# Patient Record
Sex: Female | Born: 1940
Health system: Southern US, Community
[De-identification: ages and names within clinical notes are randomized; demographics above are authoritative.]

## PROBLEM LIST (undated history)

## (undated) DIAGNOSIS — E7800 Pure hypercholesterolemia, unspecified: Secondary | ICD-10-CM

## (undated) DIAGNOSIS — I10 Essential (primary) hypertension: Secondary | ICD-10-CM

## (undated) DIAGNOSIS — C50919 Malignant neoplasm of unspecified site of unspecified female breast: Secondary | ICD-10-CM

## (undated) DIAGNOSIS — F419 Anxiety disorder, unspecified: Secondary | ICD-10-CM

## (undated) DIAGNOSIS — R011 Cardiac murmur, unspecified: Secondary | ICD-10-CM

## (undated) DIAGNOSIS — M1990 Unspecified osteoarthritis, unspecified site: Secondary | ICD-10-CM

## (undated) DIAGNOSIS — C50412 Malignant neoplasm of upper-outer quadrant of left female breast: Principal | ICD-10-CM

## (undated) DIAGNOSIS — R350 Frequency of micturition: Secondary | ICD-10-CM

## (undated) DIAGNOSIS — Z803 Family history of malignant neoplasm of breast: Secondary | ICD-10-CM

## (undated) HISTORY — DX: Unspecified osteoarthritis, unspecified site: M19.90

## (undated) HISTORY — PX: COLONOSCOPY: SHX174

## (undated) HISTORY — DX: Malignant neoplasm of unspecified site of unspecified female breast: C50.919

## (undated) HISTORY — DX: Family history of malignant neoplasm of breast: Z80.3

## (undated) HISTORY — PX: EYE SURGERY: SHX253

## (undated) HISTORY — PX: BREAST LUMPECTOMY: SHX2

## (undated) HISTORY — PX: BREAST EXCISIONAL BIOPSY: SUR124

## (undated) HISTORY — PX: TONSILLECTOMY: SUR1361

## (undated) HISTORY — PX: BREAST SURGERY: SHX581

## (undated) HISTORY — DX: Malignant neoplasm of upper-outer quadrant of left female breast: C50.412

---

## 2010-11-12 ENCOUNTER — Other Ambulatory Visit: Payer: Self-pay | Admitting: Family Medicine

## 2010-11-12 DIAGNOSIS — R809 Proteinuria, unspecified: Secondary | ICD-10-CM

## 2011-05-27 ENCOUNTER — Emergency Department (HOSPITAL_COMMUNITY): Payer: Medicare Other

## 2011-05-27 ENCOUNTER — Inpatient Hospital Stay (HOSPITAL_COMMUNITY)
Admission: EM | Admit: 2011-05-27 | Discharge: 2011-05-29 | DRG: 312 | Disposition: A | Discharge status: Home / Self Care | Payer: Medicare Other | Attending: Internal Medicine | Admitting: Internal Medicine

## 2011-05-27 ENCOUNTER — Encounter (HOSPITAL_COMMUNITY): Payer: Self-pay | Admitting: Neurology

## 2011-05-27 DIAGNOSIS — N390 Urinary tract infection, site not specified: Secondary | ICD-10-CM | POA: Diagnosis present

## 2011-05-27 DIAGNOSIS — E7800 Pure hypercholesterolemia, unspecified: Secondary | ICD-10-CM | POA: Diagnosis present

## 2011-05-27 DIAGNOSIS — R55 Syncope and collapse: Principal | ICD-10-CM | POA: Diagnosis present

## 2011-05-27 DIAGNOSIS — E782 Mixed hyperlipidemia: Secondary | ICD-10-CM

## 2011-05-27 DIAGNOSIS — Z888 Allergy status to other drugs, medicaments and biological substances status: Secondary | ICD-10-CM

## 2011-05-27 DIAGNOSIS — E785 Hyperlipidemia, unspecified: Secondary | ICD-10-CM | POA: Diagnosis present

## 2011-05-27 DIAGNOSIS — Z79899 Other long term (current) drug therapy: Secondary | ICD-10-CM

## 2011-05-27 DIAGNOSIS — I10 Essential (primary) hypertension: Secondary | ICD-10-CM | POA: Diagnosis present

## 2011-05-27 DIAGNOSIS — R9431 Abnormal electrocardiogram [ECG] [EKG]: Secondary | ICD-10-CM | POA: Diagnosis present

## 2011-05-27 HISTORY — DX: Pure hypercholesterolemia, unspecified: E78.00

## 2011-05-27 HISTORY — DX: Essential (primary) hypertension: I10

## 2011-05-27 LAB — URINALYSIS, ROUTINE W REFLEX MICROSCOPIC
Glucose, UA: NEGATIVE mg/dL
Protein, ur: 100 mg/dL — AB
Specific Gravity, Urine: 1.019 (ref 1.005–1.030)
pH: 6 (ref 5.0–8.0)

## 2011-05-27 LAB — DIFFERENTIAL
Basophils Absolute: 0 10*3/uL (ref 0.0–0.1)
Basophils Relative: 0 % (ref 0–1)
Eosinophils Absolute: 0.1 10*3/uL (ref 0.0–0.7)
Eosinophils Relative: 1 % (ref 0–5)
Monocytes Absolute: 0.3 10*3/uL (ref 0.1–1.0)

## 2011-05-27 LAB — COMPREHENSIVE METABOLIC PANEL
AST: 20 U/L (ref 0–37)
Albumin: 4.3 g/dL (ref 3.5–5.2)
Calcium: 10.4 mg/dL (ref 8.4–10.5)
Creatinine, Ser: 0.9 mg/dL (ref 0.50–1.10)
GFR calc non Af Amer: 63 mL/min — ABNORMAL LOW (ref >90)
Sodium: 142 mEq/L (ref 135–145)
Total Protein: 8.2 g/dL (ref 6.0–8.3)

## 2011-05-27 LAB — CBC
HCT: 38.1 % (ref 36.0–46.0)
MCHC: 33.6 g/dL (ref 30.0–36.0)
MCV: 85.6 fL (ref 78.0–100.0)
Platelets: 305 10*3/uL (ref 150–400)
RDW: 13.3 % (ref 11.5–15.5)

## 2011-05-27 LAB — URINE MICROSCOPIC-ADD ON

## 2011-05-27 LAB — D-DIMER, QUANTITATIVE: D-Dimer, Quant: 2.15 ug/mL-FEU — ABNORMAL HIGH (ref 0.00–0.48)

## 2011-05-27 LAB — CARDIAC PANEL(CRET KIN+CKTOT+MB+TROPI): CK, MB: 1.7 ng/mL (ref 0.3–4.0)

## 2011-05-27 LAB — POCT I-STAT TROPONIN I: Troponin i, poc: 0 ng/mL (ref 0.00–0.08)

## 2011-05-27 MED ORDER — SODIUM CHLORIDE 0.9 % IJ SOLN: 3.0000 mL | Freq: Two times a day (BID) | INTRAMUSCULAR | Status: DC

## 2011-05-27 MED ORDER — ATORVASTATIN CALCIUM 20 MG PO TABS
20.0000 mg | ORAL_TABLET | Freq: Every day | ORAL | Status: DC
  Administered 2011-05-28: 20 mg via ORAL
  Filled 2011-05-27 (×2): qty 1

## 2011-05-27 MED ORDER — OXYBUTYNIN CHLORIDE ER 5 MG PO TB24
5.0000 mg | ORAL_TABLET | Freq: Every day | ORAL | Status: DC
  Administered 2011-05-27 – 2011-05-29 (×3): 5 mg via ORAL
  Filled 2011-05-27 (×3): qty 1

## 2011-05-27 MED ORDER — SODIUM CHLORIDE 0.9 % IV SOLN: INTRAVENOUS | Status: DC

## 2011-05-27 MED ORDER — ACETAMINOPHEN 650 MG RE SUPP: 650.0000 mg | Freq: Four times a day (QID) | RECTAL | Status: DC | PRN

## 2011-05-27 MED ORDER — ONDANSETRON HCL 4 MG PO TABS: 4.0000 mg | ORAL_TABLET | Freq: Four times a day (QID) | ORAL | Status: DC | PRN

## 2011-05-27 MED ORDER — ACETAMINOPHEN 325 MG PO TABS: 650.0000 mg | ORAL_TABLET | Freq: Four times a day (QID) | ORAL | Status: DC | PRN

## 2011-05-27 MED ORDER — SODIUM CHLORIDE 0.9 % IV SOLN: Freq: Once | INTRAVENOUS | Status: DC

## 2011-05-27 MED ORDER — SODIUM CHLORIDE 0.9 % IV SOLN
INTRAVENOUS | Status: DC
Start: 2011-05-27 — End: 2011-05-29
  Administered 2011-05-27: 100 mL/h via INTRAVENOUS
  Administered 2011-05-28 (×2): via INTRAVENOUS

## 2011-05-27 MED ORDER — AMLODIPINE BESYLATE 5 MG PO TABS
5.0000 mg | ORAL_TABLET | Freq: Every day | ORAL | Status: DC
  Administered 2011-05-27 – 2011-05-29 (×3): 5 mg via ORAL
  Filled 2011-05-27 (×3): qty 1

## 2011-05-27 MED ORDER — ONDANSETRON HCL 4 MG/2ML IJ SOLN: 4.0000 mg | Freq: Four times a day (QID) | INTRAMUSCULAR | Status: DC | PRN

## 2011-05-27 MED ORDER — VERAPAMIL HCL ER 240 MG PO CP24
240.0000 mg | ORAL_CAPSULE | Freq: Every day | ORAL | Status: DC
  Administered 2011-05-27 – 2011-05-28 (×2): 240 mg via ORAL
  Filled 2011-05-27 (×3): qty 1

## 2011-05-27 MED ORDER — OLMESARTAN MEDOXOMIL 40 MG PO TABS
40.0000 mg | ORAL_TABLET | Freq: Every day | ORAL | Status: DC
  Administered 2011-05-27 – 2011-05-29 (×3): 40 mg via ORAL
  Filled 2011-05-27 (×3): qty 1

## 2011-05-27 NOTE — ED Notes (Signed)
Per ems- Pt reporting sitting in chair remembers "feeling weak", woke up in the floor covered in vomit. Episode was unwitnessed. When EMS arrived, a & o x 4. Only complaint is "weakness". No neuro deficits. CBG 122. Answering questions appropriately. 142/89, HR 77. EKG unremarkable, HR is irregular. 20 L, Hand.

## 2011-05-27 NOTE — H&P (Signed)
Kathryn Haley is an 71 y.o. female.   PCP - Dr.Stephanie Alms ([facility]). Chief Complaint: Loss of consciousness. HPI: 71 year-old female with known history of hypertension and hyperlipidemia who had recently moved from [place] last year August had an episode where she was conscious. Patient states today around 11 AM after breakfast she sat on the sofa and was about to call for her sister when all she remembers is that after a few minutes she woke up with vomitus all around. Patient denies having any headache, chest pain, palpitation, abdominal pain, shortness of breath or any focal deficits or visual changes prior or after the incident. Did not have any tongue bite or incontinence of urine. Patient does not recall how long she was unconscious for but she states may be approximately 10 minutes. In the ER patient had CT head and lab works which did not show anything acute. EKG shows diffuse ST changes compatible with early repolarization or changes like that of pericarditis. Patient specifically denies any chest pain fever or chills. Patient will be admitted for further observation. Patient states she had a similar episode 4 years ago at [place] when she was told she was dehydrated and was hospitalized for 3 days.  Past Medical History  Diagnosis Date  . Hypercholesteremia   . Hypertension     Past Surgical History  Procedure Date  . Breast surgery     History reviewed. No pertinent family history. Social History:  reports that she has never smoked. She does not have any smokeless tobacco history on file. She reports that she does not drink alcohol or use illicit drugs.  Allergies:  Allergies  Allergen Reactions  . Other Anaphylaxis    Patient had antibiotic shot in a MD office , does not know what name of drug is.    Medications Prior to Admission  Medication Sig Dispense Refill  . amLODipine (NORVASC) 5 MG tablet Take 5 mg by mouth daily.      Marland Kitchen oxybutynin (DITROPAN-XL) 5 MG  24 hr tablet Take 5 mg by mouth daily.      . rosuvastatin (CRESTOR) 10 MG tablet Take 10 mg by mouth daily.      . valsartan (DIOVAN) 320 MG tablet Take 320 mg by mouth daily.      . verapamil (VERELAN PM) 240 MG 24 hr capsule Take 240 mg by mouth at bedtime.        Results for orders placed during the hospital encounter of 05/27/11 (from the past 48 hour(s))  CBC     Status: Normal   Collection Time   05/27/11  3:50 PM      Component Value Range Comment   WBC 7.2  4.0 - 10.5 (K/uL)    RBC 4.45  3.87 - 5.11 (MIL/uL)    Hemoglobin 12.8  12.0 - 15.0 (g/dL)    HCT 72.5  36.6 - 44.0 (%)    MCV 85.6  78.0 - 100.0 (fL)    MCH 28.8  26.0 - 34.0 (pg)    MCHC 33.6  30.0 - 36.0 (g/dL)    RDW 34.7  42.5 - 95.6 (%)    Platelets 305  150 - 400 (K/uL)   DIFFERENTIAL     Status: Normal   Collection Time   05/27/11  3:50 PM      Component Value Range Comment   Neutrophils Relative 76  43 - 77 (%)    Neutro Abs 5.5  1.7 - 7.7 (K/uL)  Lymphocytes Relative 18  12 - 46 (%)    Lymphs Abs 1.3  0.7 - 4.0 (K/uL)    Monocytes Relative 4  3 - 12 (%)    Monocytes Absolute 0.3  0.1 - 1.0 (K/uL)    Eosinophils Relative 1  0 - 5 (%)    Eosinophils Absolute 0.1  0.0 - 0.7 (K/uL)    Basophils Relative 0  0 - 1 (%)    Basophils Absolute 0.0  0.0 - 0.1 (K/uL)   COMPREHENSIVE METABOLIC PANEL     Status: Abnormal   Collection Time   05/27/11  3:50 PM      Component Value Range Comment   Sodium 142  135 - 145 (mEq/L)    Potassium 4.9  3.5 - 5.1 (mEq/L)    Chloride 105  96 - 112 (mEq/L)    CO2 28  19 - 32 (mEq/L)    Glucose, Bld 95  70 - 99 (mg/dL)    BUN 16  6 - 23 (mg/dL)    Creatinine, Ser 4.09  0.50 - 1.10 (mg/dL)    Calcium 81.1  8.4 - 10.5 (mg/dL)    Total Protein 8.2  6.0 - 8.3 (g/dL)    Albumin 4.3  3.5 - 5.2 (g/dL)    AST 20  0 - 37 (U/L)    ALT 18  0 - 35 (U/L)    Alkaline Phosphatase 125 (*) 39 - 117 (U/L)    Total Bilirubin 0.4  0.3 - 1.2 (mg/dL)    GFR calc non Af Amer 63 (*) >90 (mL/min)     GFR calc Af Amer 73 (*) >90 (mL/min)   LIPASE, BLOOD     Status: Normal   Collection Time   05/27/11  3:50 PM      Component Value Range Comment   Lipase 19  11 - 59 (U/L)   LACTIC ACID, PLASMA     Status: Normal   Collection Time   05/27/11  3:51 PM      Component Value Range Comment   Lactic Acid, Venous 0.9  0.5 - 2.2 (mmol/L)   POCT I-STAT TROPONIN I     Status: Normal   Collection Time   05/27/11  4:09 PM      Component Value Range Comment   Troponin i, poc 0.00  0.00 - 0.08 (ng/mL)    Comment 3            URINALYSIS, ROUTINE W REFLEX MICROSCOPIC     Status: Abnormal   Collection Time   05/27/11  5:06 PM      Component Value Range Comment   Color, Urine YELLOW  YELLOW     APPearance CLEAR  CLEAR     Specific Gravity, Urine 1.019  1.005 - 1.030     pH 6.0  5.0 - 8.0     Glucose, UA NEGATIVE  NEGATIVE (mg/dL)    Hgb urine dipstick NEGATIVE  NEGATIVE     Bilirubin Urine NEGATIVE  NEGATIVE     Ketones, ur NEGATIVE  NEGATIVE (mg/dL)    Protein, ur 914 (*) NEGATIVE (mg/dL)    Urobilinogen, UA 0.2  0.0 - 1.0 (mg/dL)    Nitrite NEGATIVE  NEGATIVE     Leukocytes, UA SMALL (*) NEGATIVE    URINE MICROSCOPIC-ADD ON     Status: Abnormal   Collection Time   05/27/11  5:06 PM      Component Value Range Comment   Squamous Epithelial / LPF FEW (*)  RARE     WBC, UA 3-6  <3 (WBC/hpf)    RBC / HPF 0-2  <3 (RBC/hpf)    Bacteria, UA FEW (*) RARE     Casts GRANULAR CAST (*) NEGATIVE     Dg Chest 2 View  05/27/2011  *RADIOLOGY REPORT*  Clinical Data: Syncope, nausea, vomiting  CHEST - 2 VIEW  Comparison: None.  Findings: Borderline cardiomegaly is noted.  No acute infiltrate or pulmonary edema.  Degenerative changes mid thoracic spine.  Mild hyperinflation. Atherosclerotic calcifications of thoracic aorta are noted.  IMPRESSION: No active disease.  Mild hyperinflation.  Degenerative changes thoracic spine.  Original Report Authenticated By: Natasha Mead, M.D.   Ct Head Wo Contrast  05/27/2011   *RADIOLOGY REPORT*  Clinical Data: 71 year old female with weakness, found down.  CT HEAD WITHOUT CONTRAST  Technique:  Contiguous axial images were obtained from the base of the skull through the vertex without contrast.  Comparison: None.  Findings: Visualized paranasal sinuses and mastoids are clear.  No scalp hematoma. Visualized orbit soft tissues are within normal limits.  No acute osseous abnormality identified.  Hyperostosis at the anterior C1 ring probably is degenerative.  Calcified atherosclerosis at the skull base.  Mild dystrophic basal ganglia calcification. Cerebral volume is within normal limits for age.  No ventriculomegaly. No midline shift, mass effect, or evidence of mass lesion.  No acute intracranial hemorrhage identified.  No evidence of cortically based acute infarction identified.  No suspicious intracranial vascular hyperdensity.  IMPRESSION: Normal noncontrast CT appearance of the brain for age.  Original Report Authenticated By: Harley Hallmark, M.D.    Review of Systems  Constitutional: Negative.   HENT: Negative.   Eyes: Negative.   Respiratory: Negative.   Cardiovascular: Negative.   Gastrointestinal: Positive for vomiting.  Genitourinary: Negative.   Musculoskeletal: Negative.   Skin: Negative.   Neurological: Positive for loss of consciousness.  Endo/Heme/Allergies: Negative.   Psychiatric/Behavioral: Negative.     Blood pressure 146/91, pulse 73, temperature 97.9 F (36.6 C), temperature source Oral, resp. rate 18, SpO2 96.00%. Physical Exam  Constitutional: She is oriented to person, place, and time. She appears well-developed and well-nourished. No distress.  HENT:  Head: Normocephalic and atraumatic.  Right Ear: External ear normal.  Left Ear: External ear normal.  Mouth/Throat: No oropharyngeal exudate.  Eyes: Conjunctivae are normal. Pupils are equal, round, and reactive to light. Right eye exhibits no discharge. Left eye exhibits no discharge. No  scleral icterus.  Neck: Normal range of motion. Neck supple.  Cardiovascular: Normal rate and regular rhythm.   Respiratory: Effort normal and breath sounds normal. No respiratory distress. She has no wheezes. She has no rales.  GI: Soft. Bowel sounds are normal. She exhibits no distension. There is no tenderness. There is no rebound.  Musculoskeletal: Normal range of motion. She exhibits no edema and no tenderness.  Neurological: She is alert and oriented to person, place, and time.       Moves all extremities.  Skin: Skin is warm and dry. She is not diaphoretic.  Psychiatric: Her behavior is normal.     Assessment/Plan #1. Syncope cause is not clear - we will observe patient on telemetry specifically to rule out arrhythmias. Check 2-D echo given abnormal EKG. Check d-dimer. Check orthostatics and for now hydrate with IV fluids. #2. Abnormal EKG - observe in telemetry. Patient denies any chest pain. Check cardiac enzymes and 2-D echo. We have no old EKG to compare. #3. History of hypertension - continue  present medications. #4. History of hyperlipidemia - continue present medications. Patient's UA shows features of UTI but patient is asymptomatic. Urine cultures have been sent and has to be followed.  CODE STATUS - full code.  Eduard Clos 05/27/2011, 8:17 PM

## 2011-05-27 NOTE — ED Provider Notes (Addendum)
History     CSN: [id number]  Arrival date & time 05/27/11  1355   None     Chief Complaint  Patient presents with  . Loss of Consciousness    (Consider location/radiation/quality/duration/timing/severity/associated sxs/prior treatment) HPI Comments: The patient is a 71 year old woman who says that last night she went to the bathroom 7x2 urinating urinated out a lot of fluid. She has prediabetes, but is not on medicine for diabetes. She does have hypertension and high cholesterol and bladder problems for which she has medications. This morning she had eaten breakfast, and afterwards sat down in a chair felt weak, and passed out. She's not sure how long she was out, but when she woke up she was covered with vomitus. Her sister lives a couple of doors away, came over and called EMS and she was brought to the hospital for evaluation. Had no recent syncopal episode. She says that 5 years ago she was riding on a bus on a long trip, fainted, and was hospitalized for 4 days for dehydration.  Patient is a 71 y.o. female presenting with syncope. The history is provided by the patient.  Loss of Consciousness This is a new problem. The current episode started 1 to 2 hours ago. The problem has been gradually improving. Pertinent negatives include no chest pain, no abdominal pain, no headaches and no shortness of breath. Associated symptoms comments: Vomiting.. The symptoms are aggravated by nothing. The symptoms are relieved by nothing. She has tried nothing for the symptoms.    Past Medical History  Diagnosis Date  . Diabetes mellitus   . Hypercholesteremia   . Hypertension     History reviewed. No pertinent past surgical history.  No family history on file.  History  Substance Use Topics  . Smoking status: Never Smoker   . Smokeless tobacco: Not on file  . Alcohol Use: No    OB History    Grav Para Term Preterm Abortions TAB SAB Ect Mult Living                  Review of Systems    Constitutional:       Weakness.  HENT: Negative.   Eyes: Negative.   Respiratory: Negative.  Negative for shortness of breath.   Cardiovascular: Positive for syncope. Negative for chest pain.       Syncope.  Gastrointestinal: Positive for nausea and vomiting. Negative for abdominal pain and diarrhea.  Genitourinary: Negative.   Musculoskeletal: Negative.   Skin: Negative.   Neurological: Positive for syncope. Negative for headaches.  Psychiatric/Behavioral: Negative.     Allergies  Other  Home Medications   Current Outpatient Rx  Name Route Sig Dispense Refill  . AMLODIPINE BESYLATE 5 MG PO TABS Oral Take 5 mg by mouth daily.    . OXYBUTYNIN CHLORIDE ER 5 MG PO TB24 Oral Take 5 mg by mouth daily.    Marland Kitchen ROSUVASTATIN CALCIUM 10 MG PO TABS Oral Take 10 mg by mouth daily.    Marland Kitchen VALSARTAN 320 MG PO TABS Oral Take 320 mg by mouth daily.    Marland Kitchen VERAPAMIL HCL ER 240 MG PO CP24 Oral Take 240 mg by mouth at bedtime.      BP 152/81  Temp(Src) 97.4 F (36.3 C) (Oral)  Resp 20  SpO2 94%  Physical Exam  Nursing note and vitals reviewed. Constitutional: She is oriented to person, place, and time. She appears well-developed and well-nourished. No distress.  HENT:  Head: Normocephalic and atraumatic.  Right Ear: External ear normal.  Left Ear: External ear normal.  Mouth/Throat: Oropharynx is clear and moist.  Eyes: Conjunctivae and EOM are normal. Pupils are equal, round, and reactive to light. No scleral icterus.  Neck: Normal range of motion. Neck supple.  Cardiovascular: Normal rate, regular rhythm and normal heart sounds.   Pulmonary/Chest: Effort normal and breath sounds normal.  Abdominal: Soft. Bowel sounds are normal. She exhibits no distension. There is no tenderness.  Musculoskeletal: Normal range of motion. She exhibits no edema and no tenderness.  Neurological: She is alert and oriented to person, place, and time.       No sensory or motor deficit  Skin: Skin is warm and  dry.  Psychiatric: Her behavior is normal.    ED Course  Procedures (including critical care time)   3:29 PM  Date: 05/27/2011  Rate: 66  Rhythm: normal sinus rhythm  QRS Axis: normal  Intervals: normal  ST/T Wave abnormalities: Mild ST elevation in inferior and lateral leads suggests possible pericarditis.    Conduction Disutrbances:none  Narrative Interpretation: Abnormal EKG.   Old EKG Reviewed: none available  6:56 PM Results for orders placed during the hospital encounter of 05/27/11  CBC      Component Value Range   WBC 7.2  4.0 - 10.5 (K/uL)   RBC 4.45  3.87 - 5.11 (MIL/uL)   Hemoglobin 12.8  12.0 - 15.0 (g/dL)   HCT 11.9  14.7 - 82.9 (%)   MCV 85.6  78.0 - 100.0 (fL)   MCH 28.8  26.0 - 34.0 (pg)   MCHC 33.6  30.0 - 36.0 (g/dL)   RDW 56.2  13.0 - 86.5 (%)   Platelets 305  150 - 400 (K/uL)  DIFFERENTIAL      Component Value Range   Neutrophils Relative 76  43 - 77 (%)   Neutro Abs 5.5  1.7 - 7.7 (K/uL)   Lymphocytes Relative 18  12 - 46 (%)   Lymphs Abs 1.3  0.7 - 4.0 (K/uL)   Monocytes Relative 4  3 - 12 (%)   Monocytes Absolute 0.3  0.1 - 1.0 (K/uL)   Eosinophils Relative 1  0 - 5 (%)   Eosinophils Absolute 0.1  0.0 - 0.7 (K/uL)   Basophils Relative 0  0 - 1 (%)   Basophils Absolute 0.0  0.0 - 0.1 (K/uL)  COMPREHENSIVE METABOLIC PANEL      Component Value Range   Sodium 142  135 - 145 (mEq/L)   Potassium 4.9  3.5 - 5.1 (mEq/L)   Chloride 105  96 - 112 (mEq/L)   CO2 28  19 - 32 (mEq/L)   Glucose, Bld 95  70 - 99 (mg/dL)   BUN 16  6 - 23 (mg/dL)   Creatinine, Ser 7.84  0.50 - 1.10 (mg/dL)   Calcium 69.6  8.4 - 10.5 (mg/dL)   Total Protein 8.2  6.0 - 8.3 (g/dL)   Albumin 4.3  3.5 - 5.2 (g/dL)   AST 20  0 - 37 (U/L)   ALT 18  0 - 35 (U/L)   Alkaline Phosphatase 125 (*) 39 - 117 (U/L)   Total Bilirubin 0.4  0.3 - 1.2 (mg/dL)   GFR calc non Af Amer 63 (*) >90 (mL/min)   GFR calc Af Amer 73 (*) >90 (mL/min)  LIPASE, BLOOD      Component Value Range    Lipase 19  11 - 59 (U/L)  URINALYSIS, ROUTINE W REFLEX MICROSCOPIC  Component Value Range   Color, Urine YELLOW  YELLOW    APPearance CLEAR  CLEAR    Specific Gravity, Urine 1.019  1.005 - 1.030    pH 6.0  5.0 - 8.0    Glucose, UA NEGATIVE  NEGATIVE (mg/dL)   Hgb urine dipstick NEGATIVE  NEGATIVE    Bilirubin Urine NEGATIVE  NEGATIVE    Ketones, ur NEGATIVE  NEGATIVE (mg/dL)   Protein, ur 161 (*) NEGATIVE (mg/dL)   Urobilinogen, UA 0.2  0.0 - 1.0 (mg/dL)   Nitrite NEGATIVE  NEGATIVE    Leukocytes, UA SMALL (*) NEGATIVE   LACTIC ACID, PLASMA      Component Value Range   Lactic Acid, Venous 0.9  0.5 - 2.2 (mmol/L)  POCT I-STAT TROPONIN I      Component Value Range   Troponin i, poc 0.00  0.00 - 0.08 (ng/mL)   Comment 3           URINE MICROSCOPIC-ADD ON      Component Value Range   Squamous Epithelial / LPF FEW (*) RARE    WBC, UA 3-6  <3 (WBC/hpf)   RBC / HPF 0-2  <3 (RBC/hpf)   Bacteria, UA FEW (*) RARE    Casts GRANULAR CAST (*) NEGATIVE    Dg Chest 2 View  05/27/2011  *RADIOLOGY REPORT*  Clinical Data: Syncope, nausea, vomiting  CHEST - 2 VIEW  Comparison: None.  Findings: Borderline cardiomegaly is noted.  No acute infiltrate or pulmonary edema.  Degenerative changes mid thoracic spine.  Mild hyperinflation. Atherosclerotic calcifications of thoracic aorta are noted.  IMPRESSION: No active disease.  Mild hyperinflation.  Degenerative changes thoracic spine.  Original Report Authenticated By: Natasha Mead, M.D.   Ct Head Wo Contrast  05/27/2011  *RADIOLOGY REPORT*  Clinical Data: 71 year old female with weakness, found down.  CT HEAD WITHOUT CONTRAST  Technique:  Contiguous axial images were obtained from the base of the skull through the vertex without contrast.  Comparison: None.  Findings: Visualized paranasal sinuses and mastoids are clear.  No scalp hematoma. Visualized orbit soft tissues are within normal limits.  No acute osseous abnormality identified.  Hyperostosis at the  anterior C1 ring probably is degenerative.  Calcified atherosclerosis at the skull base.  Mild dystrophic basal ganglia calcification. Cerebral volume is within normal limits for age.  No ventriculomegaly. No midline shift, mass effect, or evidence of mass lesion.  No acute intracranial hemorrhage identified.  No evidence of cortically based acute infarction identified.  No suspicious intracranial vascular hyperdensity.  IMPRESSION: Normal noncontrast CT appearance of the brain for age.  Original Report Authenticated By: Harley Hallmark, M.D.    Lab workup showed no cause of pt's syncopal episode.  Will request [facility] to admit her for observation post syncope.     1. Syncope     7:13 PM Case discussed with Dr. Toniann Fail.  Admit to a telemetry bed to [facility], Dr. Donna Bernard.         Carleene Cooper III, MD 05/27/11 1913     Carleene Cooper III, MD 07/24/11 0830

## 2011-05-27 NOTE — ED Notes (Signed)
Syncopal episode was unwitnessed, pt reporting remembering "feeling weak", woke up on the floor, with emesis on shirt. Pt then called sister and 911. Pt denying any incontinence. Only complaint at this time "weakness". A & o x 4. Responding appropriately.

## 2011-05-28 ENCOUNTER — Inpatient Hospital Stay (HOSPITAL_COMMUNITY): Payer: Medicare Other

## 2011-05-28 DIAGNOSIS — E782 Mixed hyperlipidemia: Secondary | ICD-10-CM

## 2011-05-28 DIAGNOSIS — R55 Syncope and collapse: Secondary | ICD-10-CM

## 2011-05-28 DIAGNOSIS — I10 Essential (primary) hypertension: Secondary | ICD-10-CM

## 2011-05-28 LAB — URINE CULTURE: Colony Count: >=100000

## 2011-05-28 LAB — CBC
HCT: 33.9 % — ABNORMAL LOW (ref 36.0–46.0)
Hemoglobin: 11.1 g/dL — ABNORMAL LOW (ref 12.0–15.0)
MCH: 28.1 pg (ref 26.0–34.0)
MCHC: 32.7 g/dL (ref 30.0–36.0)
MCV: 85.8 fL (ref 78.0–100.0)
RDW: 13.5 % (ref 11.5–15.5)

## 2011-05-28 LAB — GLUCOSE, CAPILLARY
Glucose-Capillary: 115 mg/dL — ABNORMAL HIGH (ref 70–99)
Glucose-Capillary: 93 mg/dL (ref 70–99)

## 2011-05-28 LAB — CARDIAC PANEL(CRET KIN+CKTOT+MB+TROPI)
CK, MB: 1.2 ng/mL (ref 0.3–4.0)
CK, MB: 1.1 ng/mL (ref 0.3–4.0)
Relative Index: INVALID (ref 0.0–2.5)
Total CK: 60 U/L (ref 7–177)
Total CK: 62 U/L (ref 7–177)
Troponin I: <0.3 ng/mL (ref <0.30)

## 2011-05-28 LAB — COMPREHENSIVE METABOLIC PANEL
ALT: 14 U/L (ref 0–35)
AST: 14 U/L (ref 0–37)
Albumin: 3.5 g/dL (ref 3.5–5.2)
Alkaline Phosphatase: 104 U/L (ref 39–117)
Calcium: 9.7 mg/dL (ref 8.4–10.5)
GFR calc Af Amer: 69 mL/min — ABNORMAL LOW (ref >90)
Potassium: 4 mEq/L (ref 3.5–5.1)
Sodium: 142 mEq/L (ref 135–145)
Total Protein: 6.9 g/dL (ref 6.0–8.3)

## 2011-05-28 MED ORDER — IOHEXOL 350 MG/ML SOLN
80.0000 mL | Freq: Once | INTRAVENOUS | Status: AC | PRN
  Administered 2011-05-28: 80 mL via INTRAVENOUS

## 2011-05-28 NOTE — Progress Notes (Signed)
Subjective: States she feels better today, denies CP, denies SOB and no N/V Objective: Vital signs in last 24 hours: Temp:  [97.8 F (36.6 C)-98.6 F (37 C)] 98.6 F (37 C) (05/07 1314) Pulse Rate:  [49-77] 49  (05/07 1314) Resp:  [16-18] 18  (05/07 1314) BP: (127-156)/(69-92) 127/69 mmHg (05/07 1314) SpO2:  [94 %-100 %] 94 % (05/07 1314) Weight:  [76.6 kg (168 lb 14 oz)] 76.6 kg (168 lb 14 oz) (05/06 2021) Last BM Date: 05/26/11 Intake/Output from previous day: 05/06 0701 - 05/07 0700 In: 876.7 [I.V.:876.7] Out: 500 [Urine:500] Intake/Output this shift: Total I/O In: -  Out: 1051 [Urine:1050; Stool:1]    General Appearance:    Alert, cooperative, no distress, appears stated age  Lungs:     Clear to auscultation bilaterally, respirations unlabored   Heart:    Regular rate and rhythm, S1 and S2 normal, no murmur, rub   or gallop  Abdomen:     Soft, non-tender, bowel sounds present,    no masses, no organomegaly  Extremities:   Extremities normal, atraumatic, no cyanosis or edema  Neurologic:   CNII-XII intact, normal strength, sensation and reflexes    throughout    Weight change:   Intake/Output Summary (Last 24 hours) at 05/28/11 1728 Last data filed at 05/28/11 1500  Gross per 24 hour  Intake 876.67 ml  Output   1551 ml  Net -674.33 ml    Lab Results:   Basename 05/28/11 0508 05/27/11 1550  NA 142 142  K 4.0 4.9  CL 107 105  CO2 26 28  GLUCOSE 99 95  BUN 19 16  CREATININE 0.95 0.90  CALCIUM 9.7 10.4    Basename 05/28/11 0508 05/27/11 1550  WBC 8.2 7.2  HGB 11.1* 12.8  HCT 33.9* 38.1  PLT 267 305  MCV 85.8 85.6   PT/INR No results found for this basename: LABPROT:2,INR:2 in the last 72 hours ABG No results found for this basename: PHART:2,PCO2:2,PO2:2,HCO3:2 in the last 72 hours  Micro Results: No results found for this or any previous visit (from the past 240 hour(s)). Studies/Results: Dg Chest 2 View  05/27/2011  *RADIOLOGY REPORT*   Clinical Data: Syncope, nausea, vomiting  CHEST - 2 VIEW  Comparison: None.  Findings: Borderline cardiomegaly is noted.  No acute infiltrate or pulmonary edema.  Degenerative changes mid thoracic spine.  Mild hyperinflation. Atherosclerotic calcifications of thoracic aorta are noted.  IMPRESSION: No active disease.  Mild hyperinflation.  Degenerative changes thoracic spine.  Original Report Authenticated By: Natasha Mead, M.D.   Ct Head Wo Contrast  05/27/2011  *RADIOLOGY REPORT*  Clinical Data: 71 year old female with weakness, found down.  CT HEAD WITHOUT CONTRAST  Technique:  Contiguous axial images were obtained from the base of the skull through the vertex without contrast.  Comparison: None.  Findings: Visualized paranasal sinuses and mastoids are clear.  No scalp hematoma. Visualized orbit soft tissues are within normal limits.  No acute osseous abnormality identified.  Hyperostosis at the anterior C1 ring probably is degenerative.  Calcified atherosclerosis at the skull base.  Mild dystrophic basal ganglia calcification. Cerebral volume is within normal limits for age.  No ventriculomegaly. No midline shift, mass effect, or evidence of mass lesion.  No acute intracranial hemorrhage identified.  No evidence of cortically based acute infarction identified.  No suspicious intracranial vascular hyperdensity.  IMPRESSION: Normal noncontrast CT appearance of the brain for age.  Original Report Authenticated By: Harley Hallmark, M.D.   Ct Angio Chest  W/cm &/or Wo Cm  05/28/2011  *RADIOLOGY REPORT*  Clinical Data: Elevated D-dimer.  CT ANGIOGRAPHY CHEST  Technique:  Multidetector CT imaging of the chest using the standard protocol during bolus administration of intravenous contrast. Multiplanar reconstructed images including MIPs were obtained and reviewed to evaluate the vascular anatomy.  Contrast: 80mL OMNIPAQUE IOHEXOL 350 MG/ML SOLN  Comparison: None.  Findings: There is no filling defect within the opacified  pulmonary arteries to suggest the presence of an acute pulmonary embolus.  No thoracic aortic aneurysm.  No dissection of the thoracic aorta. Coronary artery calcification is evident.  Heart is enlarged. There is no pericardial or pleural effusion.  No axillary, mediastinal, or hilar lymphadenopathy.  8 mm elongated nodule is seen in the right lung base, just above the dome of the hemidiaphragm. The coronal and sagittal reformations reveal this area to show a peripheral wedge type configuration.  As such, it is probably atelectatic.  There is some trace subsegmental atelectasis in the lingula.  The lungs are otherwise clear.  Bone windows reveal no worrisome lytic or sclerotic osseous lesions.  IMPRESSION: No CT evidence for acute pulmonary embolus.  8 mm opacity at the right lung base is probably related to focal peripheral atelectasis.  Consider follow-up CT without contrast in 3 months to ensure resolution.  Original Report Authenticated By: ERIC A. MANSELL, M.D.   Medications:  Scheduled Meds:   . amLODipine  5 mg Oral Daily  . atorvastatin  20 mg Oral q1800  . olmesartan  40 mg Oral Daily  . oxybutynin  5 mg Oral Daily  . sodium chloride  3 mL Intravenous Q12H  . verapamil  240 mg Oral QHS  . DISCONTD: sodium chloride   Intravenous Once   Continuous Infusions:   . sodium chloride 100 mL/hr at 05/28/11 0807  . DISCONTD: sodium chloride    . DISCONTD: sodium chloride     PRN Meds:.acetaminophen, acetaminophen, iohexol, ondansetron (ZOFRAN) IV, ondansetron Assessment/Plan: 1. Syncope  -unclear etiology -await  2-D echo, carotid dopplers -CT angio neg for PE - pt not orthostatic per documented vitals this am 2. Abnormal EKG - observe in telemetry.  -Patient chest pain free.   -cardiac enzymes so far neg- x2 follow consult cards as appropriate pending results -f/u 2-D echo. We have no old EKG to compare.  3. hypertension - continue outpt medications.  4. History of hyperlipidemia -  continue statin.  5.Assymptomatic bacteruria- follow up on urine cx and further manage as appropriate   LOS: 1 day   Dragan Tamburrino C 05/28/2011, 5:28 PM

## 2011-05-28 NOTE — Progress Notes (Signed)
*  PRELIMINARY RESULTS* Vascular Ultrasound Carotid Duplex (Doppler) has been completed.   No evidence of internal carotid artery stenosis bilaterally. Bilateral antegrade vertebral artery flow. Left vertebral artery demonstrates elevated velocities.  Malachy Moan, RDMS, RDCS 05/28/2011, 1:52 PM

## 2011-05-28 NOTE — Progress Notes (Signed)
  Echocardiogram 2D Echocardiogram has been performed.  Kathryn Haley [facility] 05/28/2011, 10:38 AM

## 2011-05-28 NOTE — Progress Notes (Signed)
05/28/2011 Kathryn Haley [facility] Case Management Note [phone]  Utilization review completed.  

## 2011-05-28 NOTE — Evaluation (Signed)
Physical Therapy Evaluation Patient Details Name: Kathryn Haley MRN: [id number] DOB: 01-Nov-1940 Today's Date: 05/28/2011 Time: 4098-1191 PT Time Calculation (min): 14 min  PT Assessment / Plan / Recommendation Clinical Impression  Pt admitted with syncope and vomiting and exhibits no deficits from baseline or need for intervention. Pt agreeable with this assessment and eager to return home. Pt reports no falls or difficulty with mobility at baseline.     PT Assessment  Patent does not need any further PT services    Follow Up Recommendations  No PT follow up    Equipment Recommendations  None recommended by PT    Frequency      Precautions / Restrictions Precautions Precautions: None   Pertinent Vitals/Pain none      Mobility  Bed Mobility Bed Mobility: Supine to Sit Supine to Sit: 6: Modified independent (Device/Increase time);HOB flat Transfers Transfers: Sit to Stand;Stand to Sit Sit to Stand: 7: Independent Stand to Sit: 7: Independent Ambulation/Gait Ambulation/Gait Assistance: 7: Independent Ambulation Distance (Feet): 400 Feet Assistive device: None Ambulation/Gait Assistance Details: decreased arm swing and increased base of support but good speed and no LOB Gait Pattern: Within Functional Limits Stairs: Yes Stairs Assistance: 6: Modified independent (Device/Increase time) Stair Management Technique: One rail Left Number of Stairs: 8     Exercises     PT Goals    Visit Information  Last PT Received On: 05/28/11 Assistance Needed: +1    Subjective Data  Subjective: i'm ready to go home Patient Stated Goal: leave today   Prior Functioning  Home Living Lives With: Alone Type of Home: House Home Access: Level entry Home Layout: Two level Alternate Level Stairs-Number of Steps: 13 Alternate Level Stairs-Rails: Left Bathroom Shower/Tub: Engineer, manufacturing systems: Standard Home Adaptive Equipment: None Prior Function Level of Independence:  Independent Able to Take Stairs?: Reciprically Driving: Yes Vocation: Retired Musician: No difficulties    Cognition  Overall Cognitive Status: Appears within functional limits for tasks assessed/performed Arousal/Alertness: Awake/alert Orientation Level: Appears intact for tasks assessed Behavior During Session: [facility] for tasks performed    Extremity/Trunk Assessment Right Upper Extremity Assessment RUE ROM/Strength/Tone: Within functional levels Left Upper Extremity Assessment LUE ROM/Strength/Tone: Within functional levels Right Lower Extremity Assessment RLE ROM/Strength/Tone: Within functional levels Left Lower Extremity Assessment LLE ROM/Strength/Tone: Within functional levels   Balance    End of Session PT - End of Session Activity Tolerance: Patient tolerated treatment well Patient left: in chair;with call bell/phone within reach   Delorse Lek 05/28/2011, 2:19 PM  Delaney Meigs, PT [phone]

## 2011-05-29 DIAGNOSIS — E782 Mixed hyperlipidemia: Secondary | ICD-10-CM

## 2011-05-29 DIAGNOSIS — I10 Essential (primary) hypertension: Secondary | ICD-10-CM

## 2011-05-29 DIAGNOSIS — R55 Syncope and collapse: Secondary | ICD-10-CM

## 2011-05-29 MED ORDER — CIPROFLOXACIN HCL 250 MG PO TABS: 250.0000 mg | ORAL_TABLET | Freq: Two times a day (BID) | ORAL | Status: AC

## 2011-05-29 MED ORDER — CIPROFLOXACIN HCL 250 MG PO TABS
250.0000 mg | ORAL_TABLET | Freq: Two times a day (BID) | ORAL | Status: DC
  Administered 2011-05-29: 250 mg via ORAL
  Filled 2011-05-29 (×3): qty 1

## 2011-05-29 MED FILL — Verapamil HCl Tab ER 240 MG: ORAL | Qty: 1 | Status: AC

## 2011-05-29 NOTE — Discharge Summary (Signed)
Physician Discharge Summary  Patient ID: Kathryn Haley MRN: [id number] DOB/AGE: [birth year] 71 y.o.  Admit date: 05/27/2011 Discharge date: 05/29/2011  Primary Care Physician:  Ancil Boozer, MD, MD  Discharge Diagnoses:    .Syncope .HTN (hypertension) .Hyperlipidemia UTI  Consults: None  Discharge Medications: Medication List  As of 05/29/2011  9:07 AM   TAKE these medications         amLODipine 5 MG tablet   Commonly known as: NORVASC   Take 5 mg by mouth daily.      ciprofloxacin 250 MG tablet   Commonly known as: CIPRO   Take 1 tablet (250 mg total) by mouth 2 (two) times daily.      oxybutynin 5 MG 24 hr tablet   Commonly known as: DITROPAN-XL   Take 5 mg by mouth daily.      rosuvastatin 10 MG tablet   Commonly known as: CRESTOR   Take 10 mg by mouth daily.      valsartan 320 MG tablet   Commonly known as: DIOVAN   Take 320 mg by mouth daily.      verapamil 240 MG 24 hr capsule   Commonly known as: VERELAN PM   Take 240 mg by mouth at bedtime.             Brief H and P: For complete details please refer to admission H and P, but in brief the patient is a 71 year old female with history of hypertension, hyperlipidemia who had recently moved from [place] last year had an episode of syncope. Per the admission history patient had stated that around 11 AM after breakfast on the day of admission she said on the sofa and was about to call her sister when she woke up with the vomitus all around. Patient was unable to recall how long she was unconscious. In the ED, patient had CT scan and lab works which did not show anything acute. EKG had shown diffuse ST changes compatible with early repolarization and she was admitted for further workup.  Hospital Course:  Syncope: unclear etiology, possibly vasovagal. CT angiogram was done which was negative for PE. Patient was not orthostatic per documented vitals. 2-D echo showed EF of 55-60% with normal wall motion and no regional  wall motion abnormalities. Carotid Dopplers showed no significant extracranial carotid artery stenosis. Patient was evaluated by physical therapy and did extremely well. 2. Abnormal EKG - patient was observed on telemetry, had no significant arrhythmias, 2-D echo was done which showed EF of 55-60% with normal wall motion. Cardiac enzymes have remained negative 3. hypertension - patient was continued on outpatient medications.  4. History of hyperlipidemia - continued statin.  5. UTI-urine culture did show more than 100,000 colonies, patient was started on ciprofloxacin  Day of Discharge BP 125/75  Pulse 62  Temp(Src) 97.9 F (36.6 C) (Oral)  Resp 18  Ht 5\' 5"  (1.651 m)  Wt 76.6 kg (168 lb 14 oz)  BMI 28.10 kg/m2  SpO2 95%  Physical Exam: General: Alert and awake oriented x3 not in any acute distress. HEENT: anicteric sclera, pupils reactive to light and accommodation CVS: S1-S2 clear no murmur rubs or gallops Chest: clear to auscultation bilaterally, no wheezing rales or rhonchi Abdomen: soft nontender, nondistended, normal bowel sounds, no organomegaly Extremities: no cyanosis, clubbing or edema noted bilaterally Neuro: Cranial nerves II-XII intact, no focal neurological deficits   The results of significant diagnostics from this hospitalization (including imaging, microbiology, ancillary and laboratory) are listed below for  reference.    LAB RESULTS: Basic Metabolic Panel:  Lab 05/28/11 1610 05/27/11 1550  NA 142 142  K 4.0 4.9  CL 107 105  CO2 26 28  GLUCOSE 99 95  BUN 19 16  CREATININE 0.95 0.90  CALCIUM 9.7 10.4  MG -- --  PHOS -- --   Liver Function Tests:  Lab 05/28/11 0508 05/27/11 1550  AST 14 20  ALT 14 18  ALKPHOS 104 125*  BILITOT 0.3 0.4  PROT 6.9 8.2  ALBUMIN 3.5 4.3    Lab 05/27/11 1550  LIPASE 19  AMYLASE --   CBC:  Lab 05/28/11 0508 05/27/11 1550  WBC 8.2 7.2  NEUTROABS -- 5.5  HGB 11.1* 12.8  HCT 33.9* 38.1  MCV 85.8 --  PLT 267 305    Cardiac Enzymes:  Lab 05/28/11 1200 05/28/11 0508  CKTOTAL 62 60  CKMB 1.1 1.2  CKMBINDEX -- --  TROPONINI <0.30 <0.30   BNP: No components found with this basename: POCBNP:2 CBG:  Lab 05/28/11 1623 05/28/11 1145  GLUCAP 93 142*    Significant Diagnostic Studies:  Dg Chest 2 View  05/27/2011  *RADIOLOGY REPORT*  Clinical Data: Syncope, nausea, vomiting  CHEST - 2 VIEW  Comparison: None.  Findings: Borderline cardiomegaly is noted.  No acute infiltrate or pulmonary edema.  Degenerative changes mid thoracic spine.  Mild hyperinflation. Atherosclerotic calcifications of thoracic aorta are noted.  IMPRESSION: No active disease.  Mild hyperinflation.  Degenerative changes thoracic spine.  Original Report Authenticated By: Natasha Mead, M.D.   Ct Head Wo Contrast  05/27/2011  *RADIOLOGY REPORT*  Clinical Data: 71 year old female with weakness, found down.  CT HEAD WITHOUT CONTRAST  Technique:  Contiguous axial images were obtained from the base of the skull through the vertex without contrast.  Comparison: None.  Findings: Visualized paranasal sinuses and mastoids are clear.  No scalp hematoma. Visualized orbit soft tissues are within normal limits.  No acute osseous abnormality identified.  Hyperostosis at the anterior C1 ring probably is degenerative.  Calcified atherosclerosis at the skull base.  Mild dystrophic basal ganglia calcification. Cerebral volume is within normal limits for age.  No ventriculomegaly. No midline shift, mass effect, or evidence of mass lesion.  No acute intracranial hemorrhage identified.  No evidence of cortically based acute infarction identified.  No suspicious intracranial vascular hyperdensity.  IMPRESSION: Normal noncontrast CT appearance of the brain for age.  Original Report Authenticated By: Harley Hallmark, M.D.   Ct Angio Chest W/cm &/or Wo Cm  05/28/2011  *RADIOLOGY REPORT*  Clinical Data: Elevated D-dimer.  CT ANGIOGRAPHY CHEST  Technique:  Multidetector CT  imaging of the chest using the standard protocol during bolus administration of intravenous contrast. Multiplanar reconstructed images including MIPs were obtained and reviewed to evaluate the vascular anatomy.  Contrast: 80mL OMNIPAQUE IOHEXOL 350 MG/ML SOLN  Comparison: None.  Findings: There is no filling defect within the opacified pulmonary arteries to suggest the presence of an acute pulmonary embolus.  No thoracic aortic aneurysm.  No dissection of the thoracic aorta. Coronary artery calcification is evident.  Heart is enlarged. There is no pericardial or pleural effusion.  No axillary, mediastinal, or hilar lymphadenopathy.  8 mm elongated nodule is seen in the right lung base, just above the dome of the hemidiaphragm. The coronal and sagittal reformations reveal this area to show a peripheral wedge type configuration.  As such, it is probably atelectatic.  There is some trace subsegmental atelectasis in the lingula.  The lungs  are otherwise clear.  Bone windows reveal no worrisome lytic or sclerotic osseous lesions.  IMPRESSION: No CT evidence for acute pulmonary embolus.  8 mm opacity at the right lung base is probably related to focal peripheral atelectasis.  Consider follow-up CT without contrast in 3 months to ensure resolution.  Original Report Authenticated By: ERIC A. MANSELL, M.D.     Disposition and Follow-up: Discharge Orders    Future Orders Please Complete By Expires   Diet - low sodium heart healthy      Increase activity slowly          DISPOSITION: Home  DIET: Heart healthy  ACTIVITY: As tolerated   DISCHARGE FOLLOW-UP Follow-up Information    Follow up with ALM,STEPHANIE, MD. Schedule an appointment as soon as possible for a visit in 10 days. [facility] FOLLOW-UP)    Contact information:   [address] [place] [address] [phone]          Time spent on Discharge: 45 mins  Signed:  Torrey Ballinas M.D. [facility] 05/29/2011, 9:07 AM

## 2011-10-08 ENCOUNTER — Other Ambulatory Visit (HOSPITAL_COMMUNITY)
Admission: RE | Admit: 2011-10-08 | Discharge: 2011-10-08 | Disposition: A | Discharge status: Home / Self Care | Payer: Medicare Other | Source: Ambulatory Visit | Attending: Obstetrics and Gynecology | Admitting: Obstetrics and Gynecology

## 2011-10-08 DIAGNOSIS — Z124 Encounter for screening for malignant neoplasm of cervix: Secondary | ICD-10-CM | POA: Insufficient documentation

## 2013-09-20 ENCOUNTER — Ambulatory Visit: Payer: Medicare Other | Attending: Family Medicine

## 2013-09-20 DIAGNOSIS — I10 Essential (primary) hypertension: Secondary | ICD-10-CM | POA: Insufficient documentation

## 2013-09-20 DIAGNOSIS — IMO0001 Reserved for inherently not codable concepts without codable children: Secondary | ICD-10-CM | POA: Insufficient documentation

## 2013-09-20 DIAGNOSIS — M545 Low back pain, unspecified: Secondary | ICD-10-CM | POA: Diagnosis not present

## 2013-09-20 DIAGNOSIS — M25569 Pain in unspecified knee: Secondary | ICD-10-CM | POA: Insufficient documentation

## 2013-09-20 DIAGNOSIS — M549 Dorsalgia, unspecified: Secondary | ICD-10-CM | POA: Insufficient documentation

## 2013-09-20 DIAGNOSIS — R5381 Other malaise: Secondary | ICD-10-CM | POA: Insufficient documentation

## 2013-09-22 ENCOUNTER — Ambulatory Visit: Payer: Medicare Other | Attending: Family Medicine | Admitting: Physical Therapy

## 2013-09-22 DIAGNOSIS — IMO0001 Reserved for inherently not codable concepts without codable children: Secondary | ICD-10-CM | POA: Insufficient documentation

## 2013-09-22 DIAGNOSIS — R5381 Other malaise: Secondary | ICD-10-CM | POA: Insufficient documentation

## 2013-09-22 DIAGNOSIS — I10 Essential (primary) hypertension: Secondary | ICD-10-CM | POA: Diagnosis not present

## 2013-09-22 DIAGNOSIS — M545 Low back pain, unspecified: Secondary | ICD-10-CM | POA: Insufficient documentation

## 2013-09-22 DIAGNOSIS — M549 Dorsalgia, unspecified: Secondary | ICD-10-CM | POA: Diagnosis not present

## 2013-09-22 DIAGNOSIS — M25569 Pain in unspecified knee: Secondary | ICD-10-CM | POA: Insufficient documentation

## 2013-09-29 ENCOUNTER — Ambulatory Visit: Payer: Medicare Other

## 2013-09-30 ENCOUNTER — Encounter: Payer: Medicare Other | Admitting: Physical Therapy

## 2013-10-07 ENCOUNTER — Encounter: Payer: Medicare Other | Admitting: Physical Therapy

## 2013-10-11 ENCOUNTER — Encounter: Payer: Medicare Other | Admitting: Physical Therapy

## 2013-10-14 ENCOUNTER — Encounter: Payer: Medicare Other | Admitting: Physical Therapy

## 2013-10-18 ENCOUNTER — Other Ambulatory Visit: Payer: Self-pay | Admitting: Family Medicine

## 2013-10-18 ENCOUNTER — Other Ambulatory Visit: Payer: Self-pay

## 2013-10-18 DIAGNOSIS — N644 Mastodynia: Secondary | ICD-10-CM

## 2013-10-18 DIAGNOSIS — Z1231 Encounter for screening mammogram for malignant neoplasm of breast: Secondary | ICD-10-CM

## 2014-02-01 ENCOUNTER — Other Ambulatory Visit (HOSPITAL_COMMUNITY): Payer: Self-pay | Admitting: Family Medicine

## 2014-02-01 DIAGNOSIS — Z1231 Encounter for screening mammogram for malignant neoplasm of breast: Secondary | ICD-10-CM

## 2014-02-24 ENCOUNTER — Ambulatory Visit (HOSPITAL_COMMUNITY)
Admission: RE | Admit: 2014-02-24 | Discharge: 2014-02-24 | Disposition: A | Discharge status: Home / Self Care | Payer: Medicare Other | Source: Ambulatory Visit | Attending: Family Medicine | Admitting: Family Medicine

## 2014-02-24 DIAGNOSIS — Z1231 Encounter for screening mammogram for malignant neoplasm of breast: Secondary | ICD-10-CM | POA: Diagnosis present

## 2014-02-28 ENCOUNTER — Other Ambulatory Visit: Payer: Self-pay | Admitting: Family Medicine

## 2014-02-28 DIAGNOSIS — R928 Other abnormal and inconclusive findings on diagnostic imaging of breast: Secondary | ICD-10-CM

## 2014-03-08 ENCOUNTER — Other Ambulatory Visit: Payer: Medicare Other

## 2014-03-11 ENCOUNTER — Ambulatory Visit
Admission: RE | Admit: 2014-03-11 | Discharge: 2014-03-11 | Disposition: A | Discharge status: Home / Self Care | Payer: Medicare Other | Source: Ambulatory Visit | Attending: Family Medicine | Admitting: Family Medicine

## 2014-03-11 ENCOUNTER — Other Ambulatory Visit: Payer: Self-pay | Admitting: Family Medicine

## 2014-03-11 DIAGNOSIS — R928 Other abnormal and inconclusive findings on diagnostic imaging of breast: Secondary | ICD-10-CM

## 2014-03-11 DIAGNOSIS — R921 Mammographic calcification found on diagnostic imaging of breast: Secondary | ICD-10-CM

## 2014-04-04 ENCOUNTER — Ambulatory Visit
Admission: RE | Admit: 2014-04-04 | Discharge: 2014-04-04 | Disposition: A | Discharge status: Home / Self Care | Payer: Medicare Other | Source: Ambulatory Visit | Attending: Family Medicine | Admitting: Family Medicine

## 2014-04-04 ENCOUNTER — Other Ambulatory Visit: Payer: Self-pay | Admitting: Family Medicine

## 2014-04-04 DIAGNOSIS — R921 Mammographic calcification found on diagnostic imaging of breast: Secondary | ICD-10-CM

## 2014-04-06 ENCOUNTER — Other Ambulatory Visit: Payer: Self-pay | Admitting: Family Medicine

## 2014-04-06 ENCOUNTER — Inpatient Hospital Stay: Admission: RE | Admit: 2014-04-06 | Payer: Medicare Other | Source: Ambulatory Visit

## 2014-04-06 DIAGNOSIS — C50912 Malignant neoplasm of unspecified site of left female breast: Secondary | ICD-10-CM

## 2014-04-11 ENCOUNTER — Other Ambulatory Visit: Payer: Self-pay | Admitting: *Deleted

## 2014-04-11 ENCOUNTER — Other Ambulatory Visit: Payer: Medicare Other

## 2014-04-11 ENCOUNTER — Telehealth: Payer: Self-pay | Admitting: *Deleted

## 2014-04-11 ENCOUNTER — Encounter: Payer: Self-pay | Admitting: *Deleted

## 2014-04-11 ENCOUNTER — Other Ambulatory Visit: Payer: Self-pay | Admitting: Family Medicine

## 2014-04-11 DIAGNOSIS — C50412 Malignant neoplasm of upper-outer quadrant of left female breast: Secondary | ICD-10-CM

## 2014-04-11 DIAGNOSIS — Z170 Estrogen receptor positive status [ER+]: Secondary | ICD-10-CM

## 2014-04-11 DIAGNOSIS — C50912 Malignant neoplasm of unspecified site of left female breast: Secondary | ICD-10-CM

## 2014-04-11 HISTORY — DX: Malignant neoplasm of upper-outer quadrant of left female breast: C50.412

## 2014-04-11 NOTE — Progress Notes (Signed)
[place] Work  Clinical Social Work was referred by patient navigator for assessment of psychosocial needs.  Clinical Social Worker contacted patient at home to offer support and assess for needs.  Patient lives alone and has no family in the area and very minimal support in the community.  CSW offered support and informed patient of CSW role.  Patient expressed concerns for transportation.  Patient stated she had a car but did not feel comfortable driving herself to appointments.  CSW and patient discussed transportation resources.  Patient was agreeable to CSW making a referral to [facility].  CSw and patient also discussed [facility], and plan to complete application at her appointment on 04/20/14.  CSW provided patient with contact information and encouraged her to call with questions or concerns.        Johnnye Lana, MSW, LCSW, OSW-C Clinical Social Worker [facility] [phone]

## 2014-04-11 NOTE — Telephone Encounter (Signed)
Left vm for pt to return call to schedule for [facility] on 3/30 at 0800. Leigh at [place] has given pt appt.  Contact information left on vm to give further instructions.

## 2014-04-12 ENCOUNTER — Telehealth: Payer: Self-pay | Admitting: *Deleted

## 2014-04-12 NOTE — Telephone Encounter (Signed)
Confirmed [facility] for 04/20/14 at 0800 .  Instructions and contact information given.

## 2014-04-13 ENCOUNTER — Ambulatory Visit
Admission: RE | Admit: 2014-04-13 | Discharge: 2014-04-13 | Disposition: A | Discharge status: Home / Self Care | Payer: Medicare Other | Source: Ambulatory Visit | Attending: Family Medicine | Admitting: Family Medicine

## 2014-04-13 DIAGNOSIS — C50912 Malignant neoplasm of unspecified site of left female breast: Secondary | ICD-10-CM

## 2014-04-13 MED ORDER — GADOBENATE DIMEGLUMINE 529 MG/ML IV SOLN
15.0000 mL | Freq: Once | INTRAVENOUS | Status: AC | PRN
  Administered 2014-04-13: 15 mL via INTRAVENOUS

## 2014-04-14 ENCOUNTER — Other Ambulatory Visit: Payer: Medicare Other

## 2014-04-20 ENCOUNTER — Ambulatory Visit: Payer: Medicare Other | Attending: General Surgery | Admitting: Physical Therapy

## 2014-04-20 ENCOUNTER — Ambulatory Visit (HOSPITAL_BASED_OUTPATIENT_CLINIC_OR_DEPARTMENT_OTHER): Payer: Medicare Other | Admitting: Oncology

## 2014-04-20 ENCOUNTER — Telehealth: Payer: Self-pay | Admitting: Oncology

## 2014-04-20 ENCOUNTER — Encounter: Payer: Self-pay | Admitting: Oncology

## 2014-04-20 ENCOUNTER — Ambulatory Visit: Payer: Medicare Other

## 2014-04-20 ENCOUNTER — Encounter: Payer: Self-pay | Admitting: General Practice

## 2014-04-20 ENCOUNTER — Other Ambulatory Visit: Payer: Self-pay | Admitting: General Surgery

## 2014-04-20 ENCOUNTER — Encounter: Payer: Self-pay | Admitting: Skilled Nursing Facility1

## 2014-04-20 ENCOUNTER — Ambulatory Visit
Admission: RE | Admit: 2014-04-20 | Discharge: 2014-04-20 | Disposition: A | Discharge status: Home / Self Care | Payer: Medicare Other | Source: Ambulatory Visit | Attending: Radiation Oncology | Admitting: Radiation Oncology

## 2014-04-20 ENCOUNTER — Encounter: Payer: Self-pay | Admitting: Physical Therapy

## 2014-04-20 ENCOUNTER — Other Ambulatory Visit (HOSPITAL_BASED_OUTPATIENT_CLINIC_OR_DEPARTMENT_OTHER): Payer: Medicare Other

## 2014-04-20 VITALS — BP 168/70 | HR 66 | Temp 98.2°F | Resp 18 | Ht 65.0 in | Wt 167.5 lb

## 2014-04-20 DIAGNOSIS — R55 Syncope and collapse: Secondary | ICD-10-CM

## 2014-04-20 DIAGNOSIS — Z170 Estrogen receptor positive status [ER+]: Secondary | ICD-10-CM | POA: Diagnosis not present

## 2014-04-20 DIAGNOSIS — C50412 Malignant neoplasm of upper-outer quadrant of left female breast: Secondary | ICD-10-CM | POA: Diagnosis not present

## 2014-04-20 DIAGNOSIS — C50912 Malignant neoplasm of unspecified site of left female breast: Secondary | ICD-10-CM

## 2014-04-20 DIAGNOSIS — I10 Essential (primary) hypertension: Secondary | ICD-10-CM

## 2014-04-20 DIAGNOSIS — R293 Abnormal posture: Secondary | ICD-10-CM | POA: Diagnosis not present

## 2014-04-20 DIAGNOSIS — E785 Hyperlipidemia, unspecified: Secondary | ICD-10-CM

## 2014-04-20 LAB — COMPREHENSIVE METABOLIC PANEL (CC13)
ALT: 12 U/L (ref 0–55)
ANION GAP: 8 meq/L (ref 3–11)
AST: 14 U/L (ref 5–34)
Albumin: 3.7 g/dL (ref 3.5–5.0)
Alkaline Phosphatase: 109 U/L (ref 40–150)
BUN: 15.5 mg/dL (ref 7.0–26.0)
CALCIUM: 9.8 mg/dL (ref 8.4–10.4)
CO2: 25 meq/L (ref 22–29)
Chloride: 109 mEq/L (ref 98–109)
Creatinine: 0.8 mg/dL (ref 0.6–1.1)
EGFR: 84 mL/min/{1.73_m2} — ABNORMAL LOW (ref >90)
GLUCOSE: 100 mg/dL (ref 70–140)
POTASSIUM: 3.6 meq/L (ref 3.5–5.1)
SODIUM: 142 meq/L (ref 136–145)
Total Bilirubin: 0.41 mg/dL (ref 0.20–1.20)
Total Protein: 7.3 g/dL (ref 6.4–8.3)

## 2014-04-20 LAB — CBC WITH DIFFERENTIAL/PLATELET
BASO%: 0.7 % (ref 0.0–2.0)
Basophils Absolute: 0 10*3/uL (ref 0.0–0.1)
EOS ABS: 0.3 10*3/uL (ref 0.0–0.5)
EOS%: 5.7 % (ref 0.0–7.0)
HCT: 36.7 % (ref 34.8–46.6)
HGB: 11.7 g/dL (ref 11.6–15.9)
LYMPH#: 2.1 10*3/uL (ref 0.9–3.3)
LYMPH%: 36.3 % (ref 14.0–49.7)
MCH: 27.7 pg (ref 25.1–34.0)
MCHC: 31.9 g/dL (ref 31.5–36.0)
MCV: 86.6 fL (ref 79.5–101.0)
MONO#: 0.4 10*3/uL (ref 0.1–0.9)
MONO%: 7.7 % (ref 0.0–14.0)
NEUT#: 2.8 10*3/uL (ref 1.5–6.5)
NEUT%: 49.6 % (ref 38.4–76.8)
PLATELETS: 293 10*3/uL (ref 145–400)
RBC: 4.24 10*6/uL (ref 3.70–5.45)
RDW: 14.1 % (ref 11.2–14.5)
WBC: 5.7 10*3/uL (ref 3.9–10.3)

## 2014-04-20 NOTE — Progress Notes (Signed)
[facility]  [phone] Fax:([phone]     ID: Rozanna Box DOB: 1940/06/25  MR#: [id number]  UVO#:[id number]  Patient Care Team: Patria Mane, MD as PCP - General (Family Medicine) Excell Seltzer, MD as Consulting Physician (General Surgery) Chauncey Cruel, MD as Consulting Physician (Oncology) Thea Silversmith, MD as Consulting Physician (Radiation Oncology) Mauro Kaufmann, RN as Registered Nurse Rockwell Germany, RN as Registered Nurse PCP: Patria Mane, MD OTHER MD: Nicki Reaper McDiarmid M.D., Dr London Pepper  CHIEF COMPLAINT: Estrogen receptor positive lobular breast cancer  CURRENT TREATMENT: Awaiting definitive surgery   BREAST CANCER HISTORY: The patient had routine breast cancer screening at [facility] 02/24/2014. This showed the breast density to be category B. Some irregularities in the left breast appeared suspicious and the patient was recalled for unilateral left [facility] March 11 2014. This showed scar tissue in the upper outer left breast corresponding to an area of surgical scar. (The patient tells me she had 3 cysts removed remotely from her left breast, but were benign). The patient has a history of prior benign excisional left breast biopsy. There were also 3 groups of calcifications in a linear fashion spanning an area of 4.9 cm.  Biopsy of the left breast upper outer quadrant 04/04/2014 showed ([id number]) invasive lobular carcinoma (E-cadherin negative) grade 2, estrogen receptor 99% positive, progesterone receptor 44% positive, both with strong staining intensity, with an MIB-1 of 16%, and no HER-2 amplification, the signals ratio being 1.06 and the number per cell 1.80.  On 04/13/2014 the patient underwent bilateral breast MRI showing in the left breast at the 3:00 location an area of irregular clumped nodular enhancement measuring 4.5 cm. There was clip artifact at the anterior aspect of  this. There were no other areas of concern in the left breast, and no abnormal appearing lymph nodes. The right breast was unremarkable.  Her subsequent history is as detailed below  INTERVAL HISTORY: Ms. Tarea Skillman") was evaluated in the multidisciplinary breast cancer clinic 04/20/2014. Her case was also discussed that same morning at the multidisciplinary breast cancer conference. At that point a preliminary plan was made for breast conserving surgery with sentinel lymph node sampling, genetics testing, Oncotype if lymph node negative, radiation therapy (most likely even if the patient undergoes mastectomy), followed by hormone. It was also noted that the patient had littlesupport locally.  REVIEW OF SYSTEMS: There were no specific symptoms leading to the February mammogram, which was routinely scheduled. The patient denies unusual headaches, visual changes, nausea, vomiting, stiff neck, or gait imbalance. There has been no cough, phlegm production, or pleurisy, no chest pain or pressure, and no change in bowel or bladder habits. The patient denies fever, rash, bleeding, unexplained fatigue or unexplained weight loss. She states she has lost approximately 6 pounds over 3 years because of stress. She has had cataract surgery to both eyes. She has a history of vertigo. She has an overactive bladder and has been evaluated for this by urology. She bruises easily. She has aches and pains here in there which are not more persistent or intense than before. For exercise she goes to [place] and pushes a cart "for miles". She has some hot flashes particularly at night. A detailed review of systems was otherwise entirely negative.  PAST MEDICAL HISTORY: Past Medical History  Diagnosis Date  . Hypercholesteremia   . Hypertension   . Breast cancer of upper-outer quadrant of left female breast 04/11/2014  .  Breast cancer   . Arthritis     PAST SURGICAL HISTORY: Past Surgical History  Procedure Laterality  Date  . Breast surgery      FAMILY HISTORY Family History  Problem Relation Age of Onset  . Brain cancer Sister   . Breast cancer Daughter   . Breast cancer Daughter    patient's father died from a heart attack at age 24. The patient's mother died from heart failure at age 29. The patient had one brother, 2 sisters. One of her sisters died from melanoma metastatic to the brain in 2007 area the patient has 3 daughters, 2 of them were diagnosed with breast cancer around the age of 71. They both live in [place] and both are doing well. She does not know whether they were genetically tested. There is no other history of breast or ovarian cancer in the family to the patient's knowledge.  GYNECOLOGIC HISTORY:  No LMP recorded. Patient is postmenopausal. Menarche age 33, first live birth age 15, the patient is Omega P3. She stopped having periods around age 89. She did not take hormone replacement.  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 She is now retired. She is separated and lives alone, with no pets. She has no family in this area other than a nephew, Alejandro Mulling, who lives 3 hours away the patient's children are well in the [place] who lives in [place] and is disabled; Iona Beard a [place] who lives in [place] and works for [employer]; and Arnette Schaumann, who also lives in [place] and is a homemaker. [place] and Freda Munro are the 2 daughters who had breast cancer in their early 31s the patient attends a local [place]: In place, and being updated through a lawyer per the patient. In case of an accident she requests we call Elon Spanner , close friend, at [phone]   HEALTH MAINTENANCE: History  Substance Use Topics  . Smoking status: Never Smoker   . Smokeless tobacco: Not on file  . Alcohol Use: No     Colonoscopy: In [place], 2008  PAP: 2014  Bone density: Never  Lipid panel:  Allergies  Allergen  Reactions  . Decadron [Dexamethasone] Anaphylaxis, Swelling and Rash    Swelling of lips and tongue  . Other Anaphylaxis    Patient had antibiotic shot in a MD office , does not know what name of drug is.    Current Outpatient Prescriptions  Medication Sig Dispense Refill  . amLODipine (NORVASC) 5 MG tablet Take 5 mg by mouth daily.    . rosuvastatin (CRESTOR) 10 MG tablet Take 10 mg by mouth daily.    . verapamil (VERELAN PM) 240 MG 24 hr capsule Take 240 mg by mouth at bedtime.     No current facility-administered medications for this visit.    OBJECTIVE: Middle-aged African-American woman who appears stated age. Filed Vitals:   04/20/14 0919  BP: 168/70  Pulse: 66  Temp: 98.2 F (36.8 C)  Resp: 18     Body mass index is 27.87 kg/(m^2).    ECOG FS:1 - Symptomatic but completely ambulatory  Ocular: Sclerae unicteric, EOMs intact Ear-nose-throat: Oropharynx clear, patient has a bridge in place Lymphatic: No cervical or supraclavicular adenopathy Lungs no rales or rhonchi, good excursion bilaterally Heart regular rate and rhythm Abd soft, nontender, positive bowel sounds MSK no focal spinal tenderness, no upper extremity lymphedema Neuro: non-focal, well-oriented, stressed  affect Breasts: The right breast is unremarkable. The left breast is status post recent biopsy. I do not palpate a well-defined mass. There are no skin or nipple changes of concern. The left axilla is benign.   LAB RESULTS:  CMP     Component Value Date/Time   NA 142 04/20/2014 0803   NA 142 05/28/2011 0508   K 3.6 04/20/2014 0803   K 4.0 05/28/2011 0508   CL 107 05/28/2011 0508   CO2 25 04/20/2014 0803   CO2 26 05/28/2011 0508   GLUCOSE 100 04/20/2014 0803   GLUCOSE 99 05/28/2011 0508   BUN 15.5 04/20/2014 0803   BUN 19 05/28/2011 0508   CREATININE 0.8 04/20/2014 0803   CREATININE 0.95 05/28/2011 0508   CALCIUM 9.8 04/20/2014 0803   CALCIUM 9.7 05/28/2011 0508   PROT 7.3 04/20/2014 0803    PROT 6.9 05/28/2011 0508   ALBUMIN 3.7 04/20/2014 0803   ALBUMIN 3.5 05/28/2011 0508   AST 14 04/20/2014 0803   AST 14 05/28/2011 0508   ALT 12 04/20/2014 0803   ALT 14 05/28/2011 0508   ALKPHOS 109 04/20/2014 0803   ALKPHOS 104 05/28/2011 0508   BILITOT 0.41 04/20/2014 0803   BILITOT 0.3 05/28/2011 0508   GFRNONAA 59* 05/28/2011 0508   GFRAA 69* 05/28/2011 0508    INo results found for: SPEP, UPEP  Lab Results  Component Value Date   WBC 5.7 04/20/2014   NEUTROABS 2.8 04/20/2014   HGB 11.7 04/20/2014   HCT 36.7 04/20/2014   MCV 86.6 04/20/2014   PLT 293 04/20/2014      Chemistry      Component Value Date/Time   NA 142 04/20/2014 0803   NA 142 05/28/2011 0508   K 3.6 04/20/2014 0803   K 4.0 05/28/2011 0508   CL 107 05/28/2011 0508   CO2 25 04/20/2014 0803   CO2 26 05/28/2011 0508   BUN 15.5 04/20/2014 0803   BUN 19 05/28/2011 0508   CREATININE 0.8 04/20/2014 0803   CREATININE 0.95 05/28/2011 0508      Component Value Date/Time   CALCIUM 9.8 04/20/2014 0803   CALCIUM 9.7 05/28/2011 0508   ALKPHOS 109 04/20/2014 0803   ALKPHOS 104 05/28/2011 0508   AST 14 04/20/2014 0803   AST 14 05/28/2011 0508   ALT 12 04/20/2014 0803   ALT 14 05/28/2011 0508   BILITOT 0.41 04/20/2014 0803   BILITOT 0.3 05/28/2011 0508       No results found for: LABCA2  No components found for: DJSHF026  No results for input(s): INR in the last 168 hours.  Urinalysis    Component Value Date/Time   COLORURINE YELLOW 05/27/2011 1706   APPEARANCEUR CLEAR 05/27/2011 1706   LABSPEC 1.019 05/27/2011 1706   PHURINE 6.0 05/27/2011 1706   GLUCOSEU NEGATIVE 05/27/2011 1706   HGBUR NEGATIVE 05/27/2011 1706   BILIRUBINUR NEGATIVE 05/27/2011 1706   KETONESUR NEGATIVE 05/27/2011 1706   PROTEINUR 100* 05/27/2011 1706   UROBILINOGEN 0.2 05/27/2011 1706   NITRITE NEGATIVE 05/27/2011 1706   LEUKOCYTESUR SMALL* 05/27/2011 1706    STUDIES: Mr Breast Bilateral W Wo Contrast  04/13/2014    CLINICAL DATA:  Biopsy proven left upper outer quadrant LCIS and invasive lobular carcinoma manifesting as mammographically detected calcifications spanning a 4.9 cm segmental distribution in the left upper outer quadrant.  LABS:  BUN and creatinine were obtained on site at [facility] at  [address]  Results:  BUN 27 mg/dL,  Creatinine 1.0  mg/dL.  EXAM: BILATERAL BREAST MRI WITH AND WITHOUT CONTRAST  TECHNIQUE: Multiplanar, multisequence MR images of both breasts were obtained prior to and following the intravenous administration of 15 ml of MultiHance.  THREE-DIMENSIONAL MR IMAGE RENDERING ON INDEPENDENT WORKSTATION:  Three-dimensional MR images were rendered by post-processing of the original MR data on an independent workstation. The three-dimensional MR images were interpreted, and findings are reported in the following complete MRI report for this study. Three dimensional images were evaluated at the independent DynaCad workstation  COMPARISON:  Prior mammograms 2016  FINDINGS: Breast composition: c. Heterogeneous fibroglandular tissue.  Background parenchymal enhancement: Moderate.  Right breast: No mass or abnormal enhancement.  Left breast: In the left breast 3 o'clock location is a segmental area of irregular clumped nodular enhancement measuring 4.5 cm in maximal antero posterior dimension by 1.6 cm transversely by 1.4 cm craniocaudad dimension. This demonstrates internal wash-in/ washout enhancing kinetics. Clip artifact is noted at the anterior aspect of this segmental area of abnormal enhancement, with transversely oriented biopsy tract with smooth rim enhancement identified at the location of prior stereotactic core needle biopsy. This corresponds to the area of mammographically evident calcifications and the biopsy-proven malignancy. No other area of abnormal enhancement is identified elsewhere in the left breast.  Lymph nodes: No abnormal appearing lymph nodes.  Ancillary findings:   None.  IMPRESSION: Abnormal segmental left breast 3 o'clock location enhancement measuring 4.5 cm in maximal antero posterior dimension, corresponding to the biopsy-proven malignancy and with mammographically identifiable segmental calcifications suggesting multicentric disease. If the patient is contemplating breast conservation, then stereotactic core needle biopsy of the more posteriorly located calcifications would be recommended in order to obtain presumed pathologic proof of multicentric disease. Bracketed needle localization could be performed if surgical quadrantectomy is planned, but there is an increased risk of excluded skip lesions/residual disease/positive margins.  No MRI evidence for malignancy in the right breast.  RECOMMENDATION: Treatment plan  BI-RADS CATEGORY  6: Known biopsy-proven malignancy.   Electronically Signed   By: Conchita Paris M.D.   On: 04/13/2014 13:46   Mm Digital Diagnostic Unilat L  04/04/2014   ADDENDUM REPORT: 04/04/2014 14:13  CLINICAL DATA:  Status post stereotactic biopsy of left breast calcifications.  EXAM: DIAGNOSTIC RIGHT MAMMOGRAM POST STEREOTACTIC BIOPSY  COMPARISON:  Previous exam(s).  FINDINGS: Mammographic images were obtained following stereotactic guided biopsy of calcifications in the upper outer quadrant. An X-shaped clip is in the upper outer quadrant as expected.  IMPRESSION: The tissue marker clip is in expected location after biopsy.  Final Assessment: Post Procedure Mammograms for Marker Placement   Electronically Signed   By: Nolon Nations M.D.   On: 04/04/2014 14:13   04/04/2014   CLINICAL DATA:  Status post stereotactic guided core biopsy left breast.  EXAM: DIAGNOSTIC LEFT MAMMOGRAM POST STEREOTACTIC BIOPSY  COMPARISON:  Previous exam(s).  FINDINGS: Mammographic images were obtained following stereotactic guided biopsy of calcifications in the upper-outer quadrant of the left breast. An X shaped clip is identified in the upper-outer quadrant,  corresponding to the anterior group of calcifications previously evaluated.  IMPRESSION: Tissue marker clip is in expected location following biopsy.  Final Assessment: Post Procedure Mammograms for Marker Placement  Electronically Signed: By: Nolon Nations M.D. On: 04/04/2014 12:38   Mm Lt Breast Bx W Loc Dev 1st Lesion Image Bx Spec Stereo Guide  04/07/2014   ADDENDUM REPORT: 04/07/2014 11:59  ADDENDUM: Pathology revealed grade II invasive lobular carcinoma and lobular carcinoma in situ with associated  calcifications in the left breast. This was found to be concordant by Dr. Shon Hale. Pathology was discussed with the patient by telephone on April 06, 2014. She reported doing well after the biopsy. Post biopsy instructions and care were reviewed and her questions were answered. Surgical consultation and bilateral breast MRI were scheduled, but the patient is going to seek treatment closer to family in the [place], [place] area. She does not have a support system in place here. My number was provided for future questions, concerns, and assistance.  Pathology results reported by Susa Raring RN, BSN on April 07, 2014.   Electronically Signed   By: Nolon Nations M.D.   On: 04/07/2014 11:59   04/07/2014   CLINICAL DATA:  The patient presents for stereotactic guided core biopsy of left breast calcifications.  EXAM: LEFT BREAST STEREOTACTIC CORE NEEDLE BIOPSY  COMPARISON:  Previous exams.  FINDINGS: The patient and I discussed the procedure of stereotactic-guided biopsy including benefits and alternatives. We discussed the high likelihood of a successful procedure. We discussed the risks of the procedure including infection, bleeding, tissue injury, clip migration, and inadequate sampling. Informed written consent was given. The usual time out protocol was performed immediately prior to the procedure.  Within the upper-outer quadrant of the left breast, there are 3 groups of calcifications measuring  a total of 4.9 x 1.0 x 1.6 cm. The most anterior of the clusters was selected for biopsy. If this group is positive for malignancy additional biopsy or biopsies may be necessary.  Using sterile technique and 2% Lidocaine as local anesthetic, under stereotactic guidance, a 9 gauge vacuum assisted device was used to perform core needle biopsy of calcifications in the upper-outer quadrant of the left breast using a lateral approach. Specimen radiograph was performed showing calcifications to be present. Specimens with calcifications are identified for pathology.  At the conclusion of the procedure, a X shaped tissue marker clip was deployed into the biopsy cavity. Follow-up 2-view mammogram was performed and dictated separately.  IMPRESSION: 1. Stereotactic-guided biopsy of left breast calcifications. No apparent complications. 2. Additional biopsies may be necessary if this biopsy is positive for malignancy. See above.  Electronically Signed: By: Nolon Nations M.D. On: 04/04/2014 16:56    ASSESSMENT: 74 y.o. [place] woman status post left breast biopsy 04/04/2014 for a clinical T2 N0, stage IIA invasive lobular carcinoma, estrogen receptor 99% positive, progesterone receptor 44% positive, with an MIB-1 of 16% and no HER-2 amplification.  (1) awaiting definitive surgery  (2) we'll request Oncotype if node negative to help with the chemotherapy decision  (3) adjuvant radiation to follow surgery  (4) adjuvant anti-estrogens to follow radiation  (5) genetics counseling requested  PLAN: We spent the better part of today's hour-long appointment discussing the biology of breast cancer in general, and the specifics of the patient's tumor in particular. Brionne understands lobular breast cancers are a "minority" among breast cancers. We treat them in general the same way we treat ductal breast cancers, but they tend to be somewhat less responsive to chemotherapy and they can be harder to detect  radiologically. In many cases with lobular breast cancers there are issues regarding persistent margins and frequently there are positive lymph nodes when clinically the axilla appeared negative.  From a systemic point of view she clearly will benefit from anti-estrogens. Whether or not she needs chemotherapy will depend on her final pathology. If lymph node negative we will request an Oncotype to help with that decision.  Because  of the patient's strong family history we have requested genetics testing. I would not postpone the definitive surgery, however, while waiting for the genetics decision because, with the patient being over 19, even if BRCA positive the residual risk of another breast cancer developing is sufficiently manageable that yearly MRIs would be adequate.  The patient has a good understanding of the overall plan. She agrees with it. She knows the goal of treatment in her case is cure. She will call with any problems that may develop before her next visit here.  Chauncey Cruel, MD   04/20/2014 12:07 PM Medical Oncology and Hematology [facility] [address] [place], [place] [address] Tel. [phone]    Fax. [phone]

## 2014-04-20 NOTE — Progress Notes (Unsigned)
Kathryn Haley Psychosocial Distress Screening [place] with Kathryn Haley at [facility] to introduce [facility] team/resources and to review distress screen per protocol.  The patient scored a 7 on the Psychosocial Distress Thermometer which indicates severe distress. Also assessed for distress and other psychosocial needs.   ONCBCN DISTRESS SCREENING 04/20/2014  Screening Type Initial Screening  Distress experienced in past week (1-10) 7  Practical problem type Transportation  Emotional problem type Nervousness/Anxiety  Spiritual/Religous concerns type Relating to God  Physical Problem type Changes in urination  Referral to clinical social work Yes  Referral to support programs Yes  Other Kathryn Haley began our visit quiet and reserved, smiling and connecting more toward the end.  She notes that she is from [place], indicating less support locally.  Per pt, she is involved with a large [place], but prefers to maintain personal privacy.  Per pt, her top supports are "prayer and a couple of friends."  Encouraged her to reach out anytime, underscoring [facility] team and programming.  Follow up needed: No.  SW has assisted Kathryn Haley with completing SCAT form for transportation.  Per pt's permission, will refer to [facility].  Also plan to follow up with handwritten note for support/encouragement.  [place], [place]

## 2014-04-20 NOTE — Therapy (Signed)
[place], [place], [address] Phone: [phone]   Fax:  [phone]  Physical Therapy Evaluation  Patient Details  Name: Kathryn Haley MRN: [id number] Date of Birth: Dec 20, 1940 Referring Provider:  Excell Seltzer, MD  Encounter Date: 04/20/2014      PT End of Session - 04/20/14 1045    Visit Number 1   Number of Visits 1   PT Start Time 0920   PT Stop Time 0945   PT Time Calculation (min) 25 min   Activity Tolerance Patient tolerated treatment well   Behavior During Therapy [facility] for tasks assessed/performed      Past Medical History  Diagnosis Date  . Hypercholesteremia   . Hypertension   . Breast cancer of upper-outer quadrant of left female breast 04/11/2014  . Breast cancer   . Arthritis     Past Surgical History  Procedure Laterality Date  . Breast surgery      There were no vitals filed for this visit.  Visit Diagnosis:  Carcinoma of upper-outer quadrant of left female breast - Plan: PT plan of care cert/re-cert  Abnormal posture - Plan: PT plan of care cert/re-cert      Subjective Assessment - 04/20/14 1035    Symptoms Patient is being seen today for a baseline assessment of her newly diagnosed left breast cancer.   Pertinent History Diagnosed around 03/30/14 with left ER/PR positive, HER2 negative upper outer quadrant breast cancer with a Ki67 of 16%.  Mass measures 4.5 cm on MRI and is DCIS with invasive lobular disease.   Patient Stated Goals Reduce lymphedema risk and learn post op shoulder ROM HEP   Currently in Pain? No/denies            [facility] PT Assessment - 04/20/14 0001    Assessment   Medical Diagnosis Left breast cancer   Onset Date 03/30/14   Precautions   Precautions Other (comment)  Active cancer   Restrictions   Weight Bearing Restrictions No   Balance Screen   Has the patient fallen in the past 6 months No   Has the patient had a decrease in activity  level because of a fear of falling?  No   Is the patient reluctant to leave their home because of a fear of falling?  No   Home Environment   Living Enviornment Private residence   Living Arrangements Alone   Prior Function   Level of Independence Independent with basic ADLs   Vocation Retired   Leisure She does not exercise but reports she stays active   Cognition   Overall Cognitive Status Difficult to assess   Difficult to assess due to --  Very quiet and overwhelmed   Posture/Postural Control   Posture/Postural Control Postural limitations   Postural Limitations Rounded Shoulders;Forward head   ROM / Strength   AROM / PROM / Strength AROM;Strength   AROM   AROM Assessment Site Shoulder   Right/Left Shoulder Right;Left   Right Shoulder Extension 57 Degrees   Right Shoulder Flexion 122 Degrees   Right Shoulder ABduction 120 Degrees   Right Shoulder Internal Rotation 72 Degrees   Left Shoulder Extension 63 Degrees   Left Shoulder Flexion 110 Degrees   Left Shoulder ABduction 110 Degrees   Left Shoulder Internal Rotation 69 Degrees   Left Shoulder External Rotation 68 Degrees   Strength   Overall Strength Within functional limits for tasks performed  LYMPHEDEMA/ONCOLOGY QUESTIONNAIRE - 04/20/14 1042    Type   Cancer Type Left breast cancer   Lymphedema Assessments   Lymphedema Assessments Upper extremities   Right Upper Extremity Lymphedema   10 cm Proximal to Olecranon Process 28 cm   Olecranon Process 24.2 cm   10 cm Proximal to Ulnar Styloid Process 22.5 cm   Just Proximal to Ulnar Styloid Process 15.5 cm   Across Hand at [employer] 19.9 cm   At Pump Back of 2nd Digit 6.3 cm   Left Upper Extremity Lymphedema   10 cm Proximal to Olecranon Process 28.5 cm   Olecranon Process 23.5 cm   10 cm Proximal to Ulnar Styloid Process 21.2 cm   Just Proximal to Ulnar Styloid Process 15.4 cm   Across Hand at [employer] 19.5 cm   At Levasy of 2nd Digit 5.9 cm       Patient was instructed today in a home exercise program today for post op shoulder range of motion. These included active assist shoulder flexion in sitting, scapular retraction, wall walking with shoulder abduction, and hands behind head external rotation.  She was encouraged to do these twice a day, holding 3 seconds and repeating 5 times when permitted by her physician.         PT Education - 04/20/14 1045    Education provided Yes   Education Details Post op shoulder ROM HEP and lymphedema risk reduction   Person(s) Educated Patient   Methods Explanation;Demonstration;Handout   Comprehension Verbalized understanding              Breast Clinic Goals - 04/20/14 1052    Patient will be able to verbalize understanding of pertinent lymphedema risk reduction practices relevant to her diagnosis specifically related to skin care.   Time 1   Period Days   Status Achieved   Patient will be able to return demonstrate and/or verbalize understanding of the post-op home exercise program related to regaining shoulder range of motion.   Time 1   Period Days   Status Achieved   Patient will be able to verbalize understanding of the importance of attending the postoperative After Breast Cancer Class for further lymphedema risk reduction education and therapeutic exercise.   Time 1   Period Days   Status Achieved              Plan - 04/20/14 1047    Clinical Impression Statement Patient was esen today for her a baseline assessment of her newly diagnosed left breast cancer.  She is planning to have a left lumpectomy with a sentinel node biopsy followed by Oncotype testing and possible radiation.  She will likely take an anti-estrogen medication.  She will benefit from physical therapy after surgery to regain shoulder ROM and strength and prevent lymphedema.   Pt will benefit from skilled therapeutic intervention in order to improve on the following deficits Decreased range of  motion;Increased edema;Decreased knowledge of precautions;Pain;Impaired UE functional use;Decreased mobility   Rehab Potential Good   Clinical Impairments Affecting Rehab Potential none   PT Frequency One time visit   PT Treatment/Interventions Patient/family education;Therapeutic exercise   Consulted and Agree with Plan of Care Patient     Patient will follow up at outpatient cancer rehab if needed following surgery.  If the patient requires physical therapy at that time, a specific plan will be dictated and sent to the referring physician for approval. The patient was educated today on appropriate basic range of motion  exercises to begin post operatively and the importance of attending the After Breast Cancer class following surgery.  Patient was educated today on lymphedema risk reduction practices as it pertains to recommendations that will benefit the patient immediately following surgery.  She verbalized good understanding.  No additional physical therapy is indicated at this time.        G-Codes - 03-May-2014 1053    Functional Assessment Tool Used Clinical Judgement   Functional Limitation Other PT primary   Other PT Primary Current Status (O8325) At least 1 percent but less than 20 percent impaired, limited or restricted   Other PT Primary Goal Status (Q9826) At least 1 percent but less than 20 percent impaired, limited or restricted   Other PT Primary Discharge Status (E1583) At least 1 percent but less than 20 percent impaired, limited or restricted       Problem List Patient Active Problem List   Diagnosis Date Noted  . Breast cancer of upper-outer quadrant of left female breast 04/11/2014  . Syncope 05/27/2011  . HTN (hypertension) 05/27/2011  . Hyperlipidemia 05/27/2011    Annia Friendly, PT 2014-05-03, 10:55 AM  [place], [place], [address] Phone: [phone]   Fax:   [phone]

## 2014-04-20 NOTE — Progress Notes (Signed)
Ms. Ramdass is a pleasant 74 y.o. female from [place], [place] with newly diagnosed grade 2 invasive lobular carcinoma and LCIS of the left breast.  Biopsy results revealed the tumor's hormone status as ER positive, PR positive, and HER2/neu negative. Ki67 is 16%.  She presents today to the [facility]) unaccompanied for treatment consideration and recommendations from the breast surgeon, radiation oncologist, and medical oncologist.     I briefly met with Ms. Ribaudo during her [facility] visit today. We discussed the purpose of the Survivorship Clinic, which will include monitoring for recurrence, coordinating completion of age and gender-appropriate cancer screenings, promotion of overall wellness, as well as managing potential late/long-term side effects of anti-cancer treatments.    The treatment plan for Ms. Turrell will likely include surgery, and anti-estrogen therapy.  Radiation therapy will be considered for her as well.  She will meet with the Genetics Counselor due to her family history of breast cancer. As of today, the intent of treatment for Ms. Kraker is cure, therefore she will be eligible for the Survivorship Clinic upon her completion of treatment.  Her survivorship care plan (SCP) document will be drafted and updated throughout the course of her treatment trajectory. She will receive the SCP in an office visit with myself in the Survivorship Clinic once she has completed treatment.   Ms. Bankson was encouraged to ask questions and all questions were answered to her satisfaction.  She was given my business card and encouraged to contact me with any concerns regarding survivorship.  I look forward to participating in her care.   Mike Craze, NP [place] [phone]

## 2014-04-20 NOTE — Progress Notes (Signed)
  Radiation Oncology         [phone]) [phone] ________________________________  Initial outpatient Consultation - Date: 04/20/2014   Name: Kathryn Haley MRN: [id number]   DOB: 1940-03-05  REFERRING PHYSICIAN: Excell Seltzer, MD  DIAGNOSIS:    ICD-9-CM ICD-10-CM   1. Breast cancer of upper-outer quadrant of left female breast 174.4 C50.412    HISTORY OF PRESENT ILLNESS::Kathryn Haley is a 74 y.o. female who has a history of an excisional biopsy for cysts in the left breast. She felt some changes in her left breast and presented with left breast calcifications which measured just under 5 cm on mammogram. MRI showed an area of 4.5 x 1.6 x 1.4 cm non mass like enhancement. The biopsy clip was anterior. Biopsy showed invasive lobular carcinoma which was ER+PR+ HER2- and Ki67 was 16%. This was grade II. She had 2 daughters at 47 with breast cancer. She had 3 previous excisions of the left breast. She is GxP3 and post menopausal. She lives alone and is interested in moving back to [place] to be with her daughters. She moved to [place] several years ago to be close to her sister who died in 04-20-12. She is very interested in breast conservation.   PREVIOUS RADIATION THERAPY: No  Past medical, social and family history were reviewed in the electronic chart. Review of symptoms was reviewed in the electronic chart. Medications were reviewed in the electronic chart.   PHYSICAL EXAM: There were no vitals filed for this visit.Marland Kitchen  IMPRESSION: T2N0 invasive lobular carcinoma of the left breast  PLAN: I spoke to the patient today regarding her diagnosis and options for treatment. We discussed the equivalence in terms of survival and local failure between mastectomy and breast conservation. We discussed the role of radiation in decreasing local failures in patients who undergo mastectomy and have positive lymph nodes or tumors greater than 5 cm. We discussed the role of radiation and decreasing local failures in patients who  undergo lumpectomy.We discussed the possible side effects including but not limited to asymptomatic rib, heart and lung damage, heart disease, skin redness, fatigue, permanent skin darkening, and chest wall swelling. We discussed increased complications that can occur with reconstruction after radiation. We discussed the process of simulation and the placement tattoos. We discussed the low likelihood of secondary malignancies.    She needs to see genetics due to her family history as well as our social workers for social support. She met with surgery and medical oncology. We discussed Oncotype. She met with our navigators and physical therapy.   I spent 60 minutes  face to face with the patient and more than 50% of that time was spent in counseling and/or coordination of care.   ------------------------------------------------  Thea Silversmith, MD

## 2014-04-20 NOTE — Progress Notes (Signed)
Checked in new patient with no issues prior to seeing the dr. She has appt and [facility] packet. She has not traveled out of country.

## 2014-04-20 NOTE — Patient Instructions (Signed)

## 2014-04-20 NOTE — Progress Notes (Signed)
Subjective:     Patient ID: Kathryn Haley, female   DOB: 1941/01/05, 74 y.o.   MRN: [id number]  HPI   Review of Systems     Objective:   Physical Exam For the patient to understand and be given the tools to implement a healthy plant based diet during their cancer diagnosis.     Assessment:     Patient was seen today and found to be dejected and was not accompanied by anyone. Pts left breast is affected. Pt began to pick up in mood as dietitian spoke. Pts sister passed away in April 25, 2012 so she has not been the same since them. Pt states she feels lonely and is missing [place] which she moved from 3 years ago and all of her family live there. Her friend from high school lives in [place] and will be staying with her during her treatment. Pt states she just does not care about eating since her sister died, she moved from [place], she separated from her significant other, and her diagnosis. Pt states she goes to church but she did not seem enthused with that community. Pt states she she will try to eat better since it is linked to her health. She will work on cooking large meals when she is in the mood and storing it in the freezer for when she is not in the mood to cook. Pt states she likes to walk around [employer] for hours using a shopping cart to lean on. 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 Pt states she drinks one ensure plus a day but does not want more than that because she is prediabetic and worries about her glucose increasing. Pt states she enjoys dressing up for church and modifying her outfits.  Pts current labs: CO2 21 and her relevant/current medication: rosuvastatin.     Plan:     Dietitian educated the patient on the importance of eating during the cancer diagnosis as well as keeping the reacurance opportunity low. Dietitian also educated the pt on the importance of physical activity for mental health as well as physical health. Dietitian encouraged Skype/Google hangout in order to  have more of a support system. The importance of legitimate, evidence based information was discussed and examples were given. A folder of evidence based information with a focus on a plant based diet and general nutrition during cancer was given to the patient.  As a part of the continuum of care the cancer dietitian's contact information was given to the patient in the event they would like to have a follow up appointment.

## 2014-04-20 NOTE — Telephone Encounter (Signed)
lvm for pt regarding to April and May appt....mailed pt appt sched/avs adn letter

## 2014-04-21 ENCOUNTER — Encounter: Payer: Self-pay | Admitting: *Deleted

## 2014-04-21 NOTE — Progress Notes (Signed)
[place] Work  Clinical Social Work met with patient in [facility] after chaplain to apply for SCAT transportation.  Ms. Luft shared she has a car, but is unable to drive at this time and is currently paying for a taxi.  CSW and patient completed SCAT application and CSW faxed paperwork.  CSW encouraged patient to contact CSW for any other questions or concerns.  Polo Riley, MSW, LCSW, OSW-C Clinical Social Worker [facility] [phone]

## 2014-04-25 ENCOUNTER — Telehealth: Payer: Self-pay | Admitting: *Deleted

## 2014-04-25 NOTE — Telephone Encounter (Signed)
Spoke to pt concerning [place] from 04/20/14. Denies questions or concerns regarding dx or treatment care plan. Discussed future appts and surgery date with pt. Gave directions and instructions. Received verbal understanding. Encourage pt to call with further questions or needs. Contact information given.

## 2014-05-01 ENCOUNTER — Other Ambulatory Visit: Payer: Self-pay | Admitting: General Surgery

## 2014-05-01 DIAGNOSIS — C50912 Malignant neoplasm of unspecified site of left female breast: Secondary | ICD-10-CM

## 2014-05-03 ENCOUNTER — Encounter (HOSPITAL_BASED_OUTPATIENT_CLINIC_OR_DEPARTMENT_OTHER): Payer: Self-pay | Admitting: *Deleted

## 2014-05-03 ENCOUNTER — Other Ambulatory Visit: Payer: Self-pay | Admitting: *Deleted

## 2014-05-03 NOTE — Progress Notes (Signed)
Pt has no close family here-has close friends to care for her-going to [facility] 4/13 for [facility] cmet-will see if they can do ekg-if not will do Pt very nervous Reviewed preop  With pt

## 2014-05-04 ENCOUNTER — Other Ambulatory Visit: Payer: Self-pay

## 2014-05-04 ENCOUNTER — Other Ambulatory Visit: Payer: Medicare Other

## 2014-05-04 ENCOUNTER — Encounter: Payer: Self-pay | Admitting: Genetic Counselor

## 2014-05-04 ENCOUNTER — Ambulatory Visit (HOSPITAL_BASED_OUTPATIENT_CLINIC_OR_DEPARTMENT_OTHER): Payer: Medicare Other | Admitting: Genetic Counselor

## 2014-05-04 DIAGNOSIS — Z808 Family history of malignant neoplasm of other organs or systems: Secondary | ICD-10-CM

## 2014-05-04 DIAGNOSIS — Z315 Encounter for genetic counseling: Secondary | ICD-10-CM | POA: Diagnosis not present

## 2014-05-04 DIAGNOSIS — C50412 Malignant neoplasm of upper-outer quadrant of left female breast: Secondary | ICD-10-CM

## 2014-05-04 DIAGNOSIS — Z803 Family history of malignant neoplasm of breast: Secondary | ICD-10-CM | POA: Diagnosis not present

## 2014-05-04 NOTE — Progress Notes (Signed)
REFERRING PROVIDER: Patria Mane, MD [address] [place], [place] [address]   Lurline Del, MD  PRIMARY PROVIDER:  Patria Mane, MD  PRIMARY REASON FOR VISIT:  No diagnosis found.   HISTORY OF PRESENT ILLNESS:   Ms. Picco, a 74 y.o. female, was seen for a [place] cancer genetics consultation at the request of Dr. Doris Cheadle due to a personal and family history of cancer.  Ms. Bossi presents to clinic today to discuss the possibility of a hereditary predisposition to cancer, genetic testing, and to further clarify her future cancer risks, as well as potential cancer risks for family members.   In 2016, at the age of 57, Ms. Niesen was diagnosed with invasive lobular carcinoma of the left breast. ER+/PR+/Her2-. She was seen in the [facility] clinic and was unsure who referred her and why.  CANCER HISTORY:    Breast cancer of upper-outer quadrant of left female breast   04/04/2014 Pathology Results Breast, left, needle core biopsy, upper outer quadrant - INVASIVE LOBULAR CARCINOMA. - LOBULAR CARCINOMA IN SITU. - ASSOCIATED CALCIFICATIONS   04/04/2014 Receptors her2 Estrogen Receptor: 99%, POSITIVE, STRONG STAINING INTENSITY Progesterone Receptor: 44%, POSITIVE, STRONG STAINING INTENSITY Proliferation Marker Ki67: 16%  HER-2/NEU BY CISH - NEGATIVE.   04/11/2014 Initial Diagnosis Breast cancer of upper-outer quadrant of left female breast   04/13/2014 Breast MRI left breast 3 o'clock location is a segmental area of irregular clumped nodular enhancement measuring 4.5 cm in maximal antero posterior dimension by 1.6 cm transversely by 1.4 cm craniocaudad dimension     Past Medical History  Diagnosis Date  . Hypercholesteremia   . Hypertension   . Breast cancer of upper-outer quadrant of left female breast 04/11/2014  . Breast cancer   . Arthritis   . Anxiety   . Urinary frequency   . Heart murmur     echo 2013-mild regurg-aortic,mitral,tricu  . Family history of breast cancer      Past Surgical History  Procedure Laterality Date  . Tonsillectomy    . Breast surgery      lt breast bx x3-negative  . Eye surgery      both cataracts  . Colonoscopy      History   Social History  . Marital Status: Legally Separated    Spouse Name: N/A  . Number of Children: 2  . Years of Education: N/A   Social History Main Topics  . Smoking status: Never Smoker   . Smokeless tobacco: Not on file  . Alcohol Use: No  . Drug Use: No  . Sexual Activity: Not on file   Other Topics Concern  . None   Social History Narrative     FAMILY HISTORY:  We obtained a detailed, 4-generation family history.  Significant diagnoses are listed below: Family History  Problem Relation Age of Onset  . Brain cancer Sister   . Breast cancer Daughter   . Breast cancer Daughter     youngest daughter possibly had genetic testing    Patient's maternal ancestors are of African American descent, and paternal ancestors are of African American descent.   GENETIC COUNSELING ASSESSMENT: ULAH OLMO is a 74 y.o. female with a personal and family history of breast cancer which somewhat suggestive of a hereditary cancer syndrome and predisposition to cancer. We, therefore, discussed and recommended the following at today's visit.   DISCUSSION: Ms. Skilling was confused on why she was referred to genetics.  We started to explain that she was referred to  determine whether her breast cancer was hereditary. She initially stated that she did not want genetic testing, and then after discussing that her youngest daughter had testing at [facility] approximately 5 years ago, she stated that she wanted testing.  In further disucssion, Ms. Dusing seemed confused on why she was here.  I was concerned that I would not be able to obtain informed consent for genetic testing.  We discussed talking with her daughters to determine if both have had genetic testing and when.  Ms. Badders planned on talking with her daughters and will  call back.  PLAN: ABased on Ms. Yuan's family history, we recommended her daughters, who were diagnosed with breast cancer in their 19s, have genetic counseling and testing.  Reportedly one has had genetic testing in the past and may need updated testing. Ms. Satre will let us know if we can be of any assistance in coordinating genetic counseling and/or testing for this family member.   Lastly, we encouraged Ms. Henigan to remain in contact with cancer genetics annually so that we can continuously update the family history and inform her of any changes in cancer genetics and testing that may be of benefit for this family.   Ms.  Crutchley questions were answered to her satisfaction today. Our contact information was provided should additional questions or concerns arise. Thank you for the referral and allowing [place] to share in the care of your patient.   Jomel Whittlesey P. Florene Glen, [place], [facility] Certified Genetic Counselor [email or web address] phone: [phone]  The patient was seen for a total of 20 minutes in face-to-face genetic counseling.  This patient was discussed with Drs. Magrinat, Lindi Adie and/or Burr Medico who agrees with the above.    _______________________________________________________________________ For Office Staff:  Number of people involved in session: 1 Was an Intern/ student involved with case: yes

## 2014-05-04 NOTE — Progress Notes (Signed)
Dr Dalbert Batman said we could wave cxr

## 2014-05-09 ENCOUNTER — Ambulatory Visit
Admission: RE | Admit: 2014-05-09 | Discharge: 2014-05-09 | Disposition: A | Discharge status: Home / Self Care | Payer: Medicare Other | Source: Ambulatory Visit | Attending: General Surgery | Admitting: General Surgery

## 2014-05-09 ENCOUNTER — Ambulatory Visit (HOSPITAL_BASED_OUTPATIENT_CLINIC_OR_DEPARTMENT_OTHER): Payer: Medicare Other | Admitting: Anesthesiology

## 2014-05-09 ENCOUNTER — Encounter (HOSPITAL_BASED_OUTPATIENT_CLINIC_OR_DEPARTMENT_OTHER): Payer: Self-pay

## 2014-05-09 ENCOUNTER — Ambulatory Visit (HOSPITAL_COMMUNITY)
Admission: RE | Admit: 2014-05-09 | Discharge: 2014-05-09 | Disposition: A | Discharge status: Home / Self Care | Payer: Medicare Other | Source: Ambulatory Visit | Attending: General Surgery | Admitting: General Surgery

## 2014-05-09 ENCOUNTER — Ambulatory Visit (HOSPITAL_BASED_OUTPATIENT_CLINIC_OR_DEPARTMENT_OTHER)
Admission: RE | Admit: 2014-05-09 | Discharge: 2014-05-09 | Disposition: A | Discharge status: Home / Self Care | Payer: Medicare Other | Source: Ambulatory Visit | Attending: General Surgery | Admitting: General Surgery

## 2014-05-09 ENCOUNTER — Encounter (HOSPITAL_BASED_OUTPATIENT_CLINIC_OR_DEPARTMENT_OTHER)
Admission: RE | Disposition: A | Discharge status: Home / Self Care | Payer: Self-pay | Source: Ambulatory Visit | Attending: General Surgery

## 2014-05-09 DIAGNOSIS — M1990 Unspecified osteoarthritis, unspecified site: Secondary | ICD-10-CM | POA: Diagnosis not present

## 2014-05-09 DIAGNOSIS — C50912 Malignant neoplasm of unspecified site of left female breast: Secondary | ICD-10-CM

## 2014-05-09 DIAGNOSIS — I10 Essential (primary) hypertension: Secondary | ICD-10-CM | POA: Diagnosis not present

## 2014-05-09 DIAGNOSIS — C50812 Malignant neoplasm of overlapping sites of left female breast: Secondary | ICD-10-CM | POA: Diagnosis present

## 2014-05-09 DIAGNOSIS — Z170 Estrogen receptor positive status [ER+]: Secondary | ICD-10-CM | POA: Diagnosis not present

## 2014-05-09 DIAGNOSIS — K219 Gastro-esophageal reflux disease without esophagitis: Secondary | ICD-10-CM | POA: Diagnosis not present

## 2014-05-09 DIAGNOSIS — K649 Unspecified hemorrhoids: Secondary | ICD-10-CM | POA: Diagnosis not present

## 2014-05-09 DIAGNOSIS — C50412 Malignant neoplasm of upper-outer quadrant of left female breast: Secondary | ICD-10-CM

## 2014-05-09 DIAGNOSIS — E780 Pure hypercholesterolemia: Secondary | ICD-10-CM | POA: Diagnosis not present

## 2014-05-09 HISTORY — DX: Anxiety disorder, unspecified: F41.9

## 2014-05-09 HISTORY — PX: BREAST LUMPECTOMY WITH NEEDLE LOCALIZATION AND AXILLARY SENTINEL LYMPH NODE BX: SHX5760

## 2014-05-09 HISTORY — DX: Cardiac murmur, unspecified: R01.1

## 2014-05-09 HISTORY — DX: Frequency of micturition: R35.0

## 2014-05-09 SURGERY — BREAST LUMPECTOMY WITH NEEDLE LOCALIZATION AND AXILLARY SENTINEL LYMPH NODE BX
Anesthesia: General | Laterality: Left

## 2014-05-09 MED ORDER — BUPIVACAINE-EPINEPHRINE 0.5% -1:200000 IJ SOLN
INTRAMUSCULAR | Status: DC | PRN
  Administered 2014-05-09: 14 mL

## 2014-05-09 MED ORDER — CEFAZOLIN SODIUM-DEXTROSE 2-3 GM-% IV SOLR
INTRAVENOUS | Status: AC
  Filled 2014-05-09: qty 50

## 2014-05-09 MED ORDER — GLYCOPYRROLATE 0.2 MG/ML IJ SOLN
INTRAMUSCULAR | Status: DC | PRN
  Administered 2014-05-09 (×2): 0.2 mg via INTRAVENOUS

## 2014-05-09 MED ORDER — HYDROCODONE-ACETAMINOPHEN 5-325 MG PO TABS: 1.0000 | ORAL_TABLET | ORAL | Status: DC | PRN

## 2014-05-09 MED ORDER — FENTANYL CITRATE (PF) 100 MCG/2ML IJ SOLN
INTRAMUSCULAR | Status: AC
  Filled 2014-05-09: qty 6

## 2014-05-09 MED ORDER — MIDAZOLAM HCL 2 MG/2ML IJ SOLN
1.0000 mg | INTRAMUSCULAR | Status: DC | PRN
  Administered 2014-05-09: 1 mg via INTRAVENOUS

## 2014-05-09 MED ORDER — SODIUM CHLORIDE 0.9 % IJ SOLN
INTRAMUSCULAR | Status: AC
  Filled 2014-05-09: qty 10

## 2014-05-09 MED ORDER — LACTATED RINGERS IV SOLN
INTRAVENOUS | Status: DC
  Administered 2014-05-09 (×2): via INTRAVENOUS

## 2014-05-09 MED ORDER — OXYCODONE HCL 5 MG/5ML PO SOLN: 5.0000 mg | Freq: Once | ORAL | Status: AC | PRN

## 2014-05-09 MED ORDER — FENTANYL CITRATE (PF) 100 MCG/2ML IJ SOLN
INTRAMUSCULAR | Status: AC
  Filled 2014-05-09: qty 2

## 2014-05-09 MED ORDER — MIDAZOLAM HCL 2 MG/2ML IJ SOLN
INTRAMUSCULAR | Status: AC
  Filled 2014-05-09: qty 2

## 2014-05-09 MED ORDER — LIDOCAINE HCL (CARDIAC) 20 MG/ML IV SOLN
INTRAVENOUS | Status: DC | PRN
  Administered 2014-05-09: 60 mg via INTRAVENOUS

## 2014-05-09 MED ORDER — METHYLENE BLUE 1 % INJ SOLN
INTRAMUSCULAR | Status: AC
  Filled 2014-05-09: qty 10

## 2014-05-09 MED ORDER — EPHEDRINE SULFATE 50 MG/ML IJ SOLN
INTRAMUSCULAR | Status: DC | PRN
  Administered 2014-05-09: 10 mg via INTRAVENOUS

## 2014-05-09 MED ORDER — FENTANYL CITRATE 0.05 MG/ML IJ SOLN
50.0000 ug | INTRAMUSCULAR | Status: DC | PRN
  Administered 2014-05-09: 50 ug via INTRAVENOUS

## 2014-05-09 MED ORDER — SODIUM CHLORIDE 0.9 % IJ SOLN
INTRAMUSCULAR | Status: DC | PRN
  Administered 2014-05-09: 5 mL

## 2014-05-09 MED ORDER — ONDANSETRON HCL 4 MG/2ML IJ SOLN: 4.0000 mg | Freq: Once | INTRAMUSCULAR | Status: DC | PRN

## 2014-05-09 MED ORDER — CHLORHEXIDINE GLUCONATE 4 % EX LIQD: 1.0000 "application " | Freq: Once | CUTANEOUS | Status: DC

## 2014-05-09 MED ORDER — FENTANYL CITRATE (PF) 100 MCG/2ML IJ SOLN: 25.0000 ug | INTRAMUSCULAR | Status: DC | PRN

## 2014-05-09 MED ORDER — TECHNETIUM TC 99M SULFUR COLLOID FILTERED
1.0000 | Freq: Once | INTRAVENOUS | Status: AC | PRN
  Administered 2014-05-09: 1 via INTRADERMAL

## 2014-05-09 MED ORDER — OXYCODONE HCL 5 MG PO TABS
5.0000 mg | ORAL_TABLET | Freq: Once | ORAL | Status: AC | PRN
  Administered 2014-05-09: 5 mg via ORAL

## 2014-05-09 MED ORDER — BUPIVACAINE-EPINEPHRINE (PF) 0.5% -1:200000 IJ SOLN
INTRAMUSCULAR | Status: AC
  Filled 2014-05-09: qty 30

## 2014-05-09 MED ORDER — PROPOFOL 10 MG/ML IV BOLUS
INTRAVENOUS | Status: DC | PRN
  Administered 2014-05-09: 160 mg via INTRAVENOUS
  Administered 2014-05-09: 20 mg via INTRAVENOUS
  Administered 2014-05-09: 30 mg via INTRAVENOUS

## 2014-05-09 MED ORDER — CEFAZOLIN SODIUM-DEXTROSE 2-3 GM-% IV SOLR
2.0000 g | INTRAVENOUS | Status: AC
  Administered 2014-05-09: 2 g via INTRAVENOUS

## 2014-05-09 MED ORDER — ONDANSETRON HCL 4 MG/2ML IJ SOLN
INTRAMUSCULAR | Status: DC | PRN
  Administered 2014-05-09: 4 mg via INTRAVENOUS

## 2014-05-09 MED ORDER — OXYCODONE HCL 5 MG PO TABS
ORAL_TABLET | ORAL | Status: AC
  Filled 2014-05-09: qty 1

## 2014-05-09 SURGICAL SUPPLY — 50 items
APPLIER CLIP 11 MED OPEN (CLIP)
BINDER BREAST XLRG (GAUZE/BANDAGES/DRESSINGS) ×2 IMPLANT
BLADE SURG 10 STRL SS (BLADE) IMPLANT
BLADE SURG 15 STRL LF DISP TIS (BLADE) ×1 IMPLANT
BLADE SURG 15 STRL SS (BLADE) ×1
CANISTER SUCT 1200ML W/VALVE (MISCELLANEOUS) ×2 IMPLANT
CHLORAPREP W/TINT 26ML (MISCELLANEOUS) ×2 IMPLANT
CLIP APPLIE 11 MED OPEN (CLIP) IMPLANT
CLIP TI WIDE RED SMALL 6 (CLIP) ×2 IMPLANT
COVER BACK TABLE 60X90IN (DRAPES) ×2 IMPLANT
COVER MAYO STAND STRL (DRAPES) ×2 IMPLANT
COVER PROBE W GEL 5X96 (DRAPES) ×2 IMPLANT
DECANTER SPIKE VIAL GLASS SM (MISCELLANEOUS) IMPLANT
DEVICE DUBIN W/COMP PLATE 8390 (MISCELLANEOUS) ×2 IMPLANT
DRAPE UTILITY XL STRL (DRAPES) ×2 IMPLANT
ELECT COATED BLADE 2.86 ST (ELECTRODE) ×2 IMPLANT
ELECT REM PT RETURN 9FT ADLT (ELECTROSURGICAL) ×2
ELECTRODE REM PT RTRN 9FT ADLT (ELECTROSURGICAL) ×1 IMPLANT
GLOVE BIOGEL PI IND STRL 7.0 (GLOVE) ×1 IMPLANT
GLOVE BIOGEL PI IND STRL 8 (GLOVE) ×1 IMPLANT
GLOVE BIOGEL PI INDICATOR 7.0 (GLOVE) ×1
GLOVE BIOGEL PI INDICATOR 8 (GLOVE) ×1
GLOVE ECLIPSE 6.5 STRL STRAW (GLOVE) ×2 IMPLANT
GLOVE ECLIPSE 7.5 STRL STRAW (GLOVE) ×2 IMPLANT
GLOVE EXAM NITRILE LRG STRL (GLOVE) ×2 IMPLANT
GOWN STRL REUS W/ TWL LRG LVL3 (GOWN DISPOSABLE) ×1 IMPLANT
GOWN STRL REUS W/ TWL XL LVL3 (GOWN DISPOSABLE) ×1 IMPLANT
GOWN STRL REUS W/TWL LRG LVL3 (GOWN DISPOSABLE) ×1
GOWN STRL REUS W/TWL XL LVL3 (GOWN DISPOSABLE) ×1
KIT MARKER MARGIN INK (KITS) ×2 IMPLANT
LIQUID BAND (GAUZE/BANDAGES/DRESSINGS) ×2 IMPLANT
NDL SAFETY ECLIPSE 18X1.5 (NEEDLE) ×1 IMPLANT
NEEDLE HYPO 18GX1.5 SHARP (NEEDLE) ×1
NEEDLE HYPO 25X1 1.5 SAFETY (NEEDLE) ×4 IMPLANT
NS IRRIG 1000ML POUR BTL (IV SOLUTION) ×2 IMPLANT
PACK BASIN DAY SURGERY FS (CUSTOM PROCEDURE TRAY) ×2 IMPLANT
PAD ALCOHOL SWAB (MISCELLANEOUS) ×4 IMPLANT
PENCIL BUTTON HOLSTER BLD 10FT (ELECTRODE) ×2 IMPLANT
SLEEVE SCD COMPRESS KNEE MED (MISCELLANEOUS) ×2 IMPLANT
SPONGE LAP 18X18 X RAY DECT (DISPOSABLE) IMPLANT
SPONGE LAP 4X18 X RAY DECT (DISPOSABLE) ×2 IMPLANT
SUT MON AB 4-0 PC3 18 (SUTURE) IMPLANT
SUT MON AB 5-0 PS2 18 (SUTURE) ×2 IMPLANT
SUT SILK 3 0 SH 30 (SUTURE) IMPLANT
SUT VICRYL 3-0 CR8 SH (SUTURE) ×4 IMPLANT
SYR CONTROL 10ML LL (SYRINGE) ×4 IMPLANT
TOWEL OR 17X24 6PK STRL BLUE (TOWEL DISPOSABLE) ×2 IMPLANT
TOWEL OR NON WOVEN STRL DISP B (DISPOSABLE) IMPLANT
TUBE CONNECTING 20X1/4 (TUBING) ×2 IMPLANT
YANKAUER SUCT BULB TIP NO VENT (SUCTIONS) ×2 IMPLANT

## 2014-05-09 NOTE — Transfer of Care (Signed)
Immediate Anesthesia Transfer of Care Note  Patient: Kathryn Haley  Procedure(s) Performed: Procedure(s): BRACKETED WIRE LOCALIZED LEFT BREAST LUMPECTOMY WITH LEFT  AXILLARY SENTINEL LYMPH NODE BX (Left)  Patient Location: PACU  Anesthesia Type:GA combined with regional for post-op pain  Level of Consciousness: sedated and patient cooperative  Airway & Oxygen Therapy: Patient Spontanous Breathing and Patient connected to face mask oxygen  Post-op Assessment: Report given to RN and Post -op Vital signs reviewed and stable  Post vital signs: Reviewed and stable  Last Vitals:  Filed Vitals:   05/09/14 1130  BP: 136/63  Pulse: 52  Temp:   Resp: 15    Complications: No apparent anesthesia complications

## 2014-05-09 NOTE — H&P (Signed)
History of Present Illness Marland Kitchen T. Elfriede Bonini MD; 04/20/2014 11:54 AM) The patient is a 74 year old female who presents with breast cancer. She is a postmenopausal female referred by Dr. Nolon Nations for evaluation of recently diagnosed carcinoma of the left breast. She recently felt some "drawing" or discomfort in her lateral left breast and presented to the breast center for evaluation. Screening mammogram revealed abnormal calcifications in the upper outer left breast. Subsequent imaging included diagnostic mamogram showing 3 groups of linear calcifications in the upper outer left breast measuring 4.9 x 1.6 x 1.0 cm. Some architectural distortion was also noted but this is in the area of a previous benign left breast biopsy. A steotactic breast biopsy was performed on 04/04/2014 with pathology revealing in situ and invasive lobular carcinoma of the breast. She is seen now in [facility] for initial treatment planning. She has experienced some left breast discomfort as described above but no mass or nipple discharge or skin changes. She ddoes have a history of previous open benign left breast biopsy Findings at that time were the following: Tumor size: 4.9 cm Tumor grade: 22 Estrogen Receptor: positive Progesterone Receptor: positive Her-2 neu: negative Lymph node status: negative   Other Problems Anderson Malta Witty, RMA; 04/20/2014 7:51 AM) Arthritis Back Pain Bladder Problems Breast Cancer Gastroesophageal Reflux Disease Heart murmur Hemorrhoids High blood pressure Hypercholesterolemia Lump In Breast  Past Surgical History Jeanann Lewandowsky, RMA; 04/20/2014 7:51 AM) Breast Biopsy Left. Cataract Surgery Bilateral. Oral Surgery Tonsillectomy  Diagnostic Studies History Anderson Malta [place], [place]; 04/20/2014 7:51 AM) Colonoscopy 5-10 years ago Mammogram within last year Pap Smear 1-5 years ago  Social History Anderson Malta [place], RMA; 04/20/2014 7:51 AM) Caffeine use  Coffee. No alcohol use No drug use Tobacco use Never smoker.  Family History Anderson Malta [place], [place]; 04/20/2014 7:51 AM) Arthritis Mother. Breast Cancer Daughter. Cerebrovascular Accident Daughter. Diabetes Mellitus Family Members In General. Hypertension Daughter, Sister. Migraine Headache Family Members In General. Prostate Cancer Family Members In General. Seizure disorder Daughter.  Pregnancy / Birth History Anderson Malta [place], [place]; 04/20/2014 7:51 AM) Age at menarche 6 years. Age of menopause 51-55 Contraceptive History Intrauterine device. Gravida 3 Irregular periods Maternal age 67-20 Para 3  Review of Systems Anderson Malta Witty RMA; 04/20/2014 7:51 AM) General Present- Weight Loss. Not Present- Appetite Loss, Chills, Fatigue, Fever, Night Sweats and Weight Gain. Skin Present- Rash. Not Present- Change in Wart/Mole, Dryness, Hives, Jaundice, New Lesions, Non-Healing Wounds and Ulcer. HEENT Present- Ringing in the Ears and Wears glasses/contact lenses. Not Present- Earache, Hearing Loss, Hoarseness, Nose Bleed, Oral Ulcers, Seasonal Allergies, Sinus Pain, Sore Throat, Visual Disturbances and Yellow Eyes. Respiratory Present- Snoring. Not Present- Bloody sputum, Chronic Cough, Difficulty Breathing and Wheezing. Breast Present- Breast Mass. Not Present- Breast Pain, Nipple Discharge and Skin Changes. Cardiovascular Not Present- Chest Pain, Difficulty Breathing Lying Down, Leg Cramps, Palpitations, Rapid Heart Rate, Shortness of Breath and Swelling of Extremities. Gastrointestinal Present- Gets full quickly at meals. Not Present- Abdominal Pain, Bloating, Bloody Stool, Change in Bowel Habits, Chronic diarrhea, Constipation, Difficulty Swallowing, Excessive gas, Hemorrhoids, Indigestion, Nausea, Rectal Pain and Vomiting. Female Genitourinary Present- Frequency, Nocturia and Urgency. Not Present- Painful Urination and Pelvic Pain. Musculoskeletal Present- Back Pain. Not  Present- Joint Pain, Joint Stiffness, Muscle Pain, Muscle Weakness and Swelling of Extremities. Neurological Not Present- Decreased Memory, Fainting, Headaches, Numbness, Seizures, Tingling, Tremor, Trouble walking and Weakness. Psychiatric Present- Anxiety and Fearful. Not Present- Bipolar, Change in Sleep Pattern, Depression and Frequent crying. Endocrine Present- Hair Changes. Not Present- Cold  Intolerance, Excessive Hunger, Heat Intolerance, Hot flashes and New Diabetes. Hematology Present- Easy Bruising and Excessive bleeding. Not Present- Gland problems, HIV and Persistent Infections.   Physical Exam Marland Kitchen T. Jassica Zazueta MD; 04/20/2014 11:56 AM) The physical exam findings are as follows: Note:General: Alert, well-developed and well nourished African-American female, in no distress Skin: Warm and dry without rash or infection. HEENT: No palpable masses or thyromegaly. Sclera nonicteric. Pupils equal round and reactive. Breasts: There is a healed circumareolar incision lateral left breast. There may be some subtle thickening in the upper outer left breast but I do not feel a discrete mass. No other abnormalities in either breast. No palpable axillary lymph nodes. Lymph nodes: No cervical, supraclavicular, or inguinal nodes palpable. Lungs: Breath sounds clear and equal. No wheezing or increased work of breathing. Cardiovascular: Regular rate and rhythm with 2/6 systolic murmur. 1+ ankle edema. Abdomen: Nondistended. Soft and nontender. No masses palpable. No organomegaly. No palpable hernias. Extremities: No edema or joint swelling or deformity. No chronic venous stasis changes. Neurologic: Alert and fully oriented. Gait normal. No focal weakness. Psychiatric: Normal mood and affect. Thought content appropriate with normal judgement and insight    Assessment & Plan Marland Kitchen T. Erskine Steinfeldt MD; 04/20/2014 12:01 PM) LOBULAR BREAST CANCER, LEFT (174.9  C50.912) Impression: 74 year old female  with a new diagnosis of cancer of the left breast, in situ and invasive lobular, upper outer quadrant. Clinical stage 1B, ERpositive, PRpositive, HER-2negative. I discussed with the patient today initial surgical treatment options. We discussed options of breast conservation with lumpectomy or total mastectomy and sentinal lymph node biopsy/dissection. Options for reconstruction were discussed. After discussion she feels strongly that she would like to pursue breast conservation if at all possible. I discussed with her that she is not an ideal candidate as she has a fairly large lobular cancer and we may not be able to obtain adequate margins. She understands this. We discussed the indications and nature of the procedure, and expected recovery, in detail. Surgical risks including anesthetic complications, cardiorespiratory complications, bleeding, infection, wound healing complications, blood clots, lymphedema, local and distant recurrence and possible need for further surgery up to and including total mastectomy based on the final pathology was discussed and understood. Chemotherapy, hormonal therapy and radiation therapy have been discussed. They have been provided with literature regarding the treatment of breast cancer. All questions were answered. she understands and agrees to proceed and we will go ahead with scheduling. Current Plans  Schedule for Surgery wire bracketed left breast lumpectomy and left axillary sentinel lymph node biopsy uunder general anesthesia as an outpatient

## 2014-05-09 NOTE — Op Note (Signed)
Preoperative Diagnosis: cancer left breast  Postoprative Diagnosis: cancer left breast  Procedure: Procedure(s): BRACKETED WIRE LOCALIZED LEFT BREAST LUMPECTOMY WITH LEFT  AXILLARY SENTINEL LYMPH NODE BX   Surgeon: Excell Seltzer T   Assistants: None  Anesthesia:  General LMA anesthesia  Indications: Patient is a 74 year old female with a recent abnormal screening mammogram showing a linear approximately 5 cm area of calcifications in the upper outer left breast running anterior to posterior. Biopsy has revealed invasive and in situ lobular carcinoma. After discussion of treatment options and risks detailed extensively elsewhere we have elected to proceed with wire bracketed left breast lumpectomy and left axillary sentinel lymph node biopsy is initial surgical treatment.    Procedure Detail:  Following accurate wire bracketing of the calcifications with 1 wire anterior and one posterior to the linear area of calcifications the patient was brought to the holding area and 1 mCi of technetium sulfur colloid was injected intradermally around the Left nipple under sedation. She was then brought to the operating room, placed in supine disorder operating table and laryngeal mask general anesthesia induced. The left arm was positioned carefully extended. Under sterile technique and after patient timeout with correct procedure verified I injected 5 mL of dilute methylene blue simultaneously beneath the Left nipple and massaged this for several minutes. Following this the entire Left breast and chest, axilla and upper arm were widely sterilely prepped and draped. Patient timeout was again performed and correct procedure verified. I made a curvilinear incision between the anterior and posterior wire which were inserted laterally. This actually incorporated a portion of a previous upper outer incision in the breast and extended this laterally and inferiorly. Dissection was carried down into the  subcutaneous changes tissue toward the breast capsule. The dissection was then extended posteriorly and anteriorly and the 2 wires were brought into the incision. I then with cautery excised a generous specimen of breast tissue staying anterior to the anterior wire and posterior to the posterior wire and a centimeter or 2 superior and inferior and dissected deeply and medially around the tips of the wires and the specimen was removed. This was oriented with ink. Specimen radiograph showed the localizing wires and biopsy clip and calcifications contained within the specimen. This was sent for permanent pathology. Before closure I mobilized some breast tissue off the chest wall medially and breast and fibrofatty tissue was mobilized off the chest wall and latissimus laterally. After complete hemostasis was obtained marking clips were placed in the lumpectomy cavity and the deep breast and simultaneous tissue was closed with interrupted 30 Vicryls. Attention was then turned to the sentinel lymph node biopsy. I localized a definite hot area in the left axilla and a small transverse incision made. Dissection was carried down with the cautery through the subcutaneous changes tissue and the clavipectoral fascia was incised. Using the neoprobe for guidance using careful blunt dissection I dissected down on to a node with elevated counts and blue dye. It appeared normal. This was elevated with a Babcock and excised with cautery. Ex vivo the node had counts of 1100. This was sent as hot blue left axillary sentinel lymph node #1. I was able to feel a slightly firm lymph node posteriorly which also had somewhat elevated counts and this was excised and sent as hot non-blue and axillary sentinel lymph node #2. Medially there was still some elevated counts and I felt another slightly firm lymph node which was exposed and excised with moderately elevated counts as hot blue  axillary sentinel lymph node #3. Finally there was a fourth  lymph node up near the axillary vein with moderately elevated counts. It was small and soft. This was completely excised and sent as hot axillary sentinel lymph node #4. Background in the axilla this point was essentially 0. Hemostasis was assured and the deep and subcutaneous changes tissue was closed with interrupted 3-0 Vicryl. Skin incisions at both sites were closed with subcuticular 5-0 Monocryl and Dermabond. Sponge needle and instrument counts were correct.   Findings: As above  Estimated Blood Loss:  Minimal         Drains: none  Blood Given: none          Specimens: Left breast lumpectomy and left axillary sentinel lymph nodes  X 4        Complications:  * No complications entered in OR log *         Disposition: PACU - hemodynamically stable.         Condition: stable

## 2014-05-09 NOTE — Anesthesia Procedure Notes (Addendum)
Anesthesia Regional Block:  Pectoralis block  Pre-Anesthetic Checklist: ,,, Correct Patient, Correct Site, Correct Laterality, Correct Procedure, Correct Position, site marked, risks and benefits discussed, Surgical consent,  Pre-op evaluation,  At surgeon's request and post-op pain management  Laterality: Left  Prep: chloraprep       Needles:  Injection technique: Single-shot  Needle Type: Echogenic Stimulator Needle     Needle Length: 9cm 9 cm Needle Gauge: 22 and 22 G    Additional Needles:  Procedures: ultrasound guided (picture in chart) Pectoralis block Narrative:  Start time: 05/09/2014 11:05 AM End time: 05/09/2014 11:10 AM Injection made incrementally with aspirations every 5 mL.  Performed by: Personally   Additional Notes: 30 cc 0.5% Marcaine with 1:200 epi injected easily   Procedure Name: LMA Insertion Performed by: Tawni Millers Pre-anesthesia Checklist: Patient identified, Emergency Drugs available, Suction available and Patient being monitored Patient Re-evaluated:Patient Re-evaluated prior to inductionOxygen Delivery Method: Circle System Utilized Preoxygenation: Pre-oxygenation with 100% oxygen Intubation Type: IV induction Ventilation: Mask ventilation without difficulty LMA: LMA inserted LMA Size: 4.0 Number of attempts: 1 Airway Equipment and Method: Bite block Placement Confirmation: positive ETCO2 Tube secured with: Tape Dental Injury: Teeth and Oropharynx as per pre-operative assessment

## 2014-05-09 NOTE — Interval H&P Note (Signed)
History and Physical Interval Note:  05/09/2014 12:03 PM  Kathryn Haley  has presented today for surgery, with the diagnosis of Cancer left breast  The various methods of treatment have been discussed with the patient and family. After consideration of risks, benefits and other options for treatment, the patient has consented to  Procedure(s): BRACKETED WIRE LOCALIZED LEFT BREAST LUMPECTOMY WITH LEFT  AXILLARY SENTINEL LYMPH NODE BX (Left) as a surgical intervention .  The patient's history has been reviewed, patient examined, no change in status, stable for surgery.  I have reviewed the patient's chart and labs.  Questions were answered to the patient's satisfaction.     Burnett Lieber T

## 2014-05-09 NOTE — Progress Notes (Signed)
Assisted Dr. Linna Caprice with left, ultrasound guided, pectoralis block. Side rails up, monitors on throughout procedure. See vital signs in flow sheet. Tolerated Procedure well.Assisted nuc med tech #[id number] nuc med inj. Side rails up, monitors on throughout procedure. See vital signs in flow sheet. Tolerated Procedure well.

## 2014-05-09 NOTE — Discharge Instructions (Signed)
[place] Office Phone Number [phone]  BREAST BIOPSY/ PARTIAL MASTECTOMY: POST OP INSTRUCTIONS  Always review your discharge instruction sheet given to you by the facility where your surgery was performed.  IF YOU HAVE DISABILITY OR FAMILY LEAVE FORMS, YOU MUST BRING THEM TO THE OFFICE FOR PROCESSING.  DO NOT GIVE THEM TO YOUR DOCTOR.  1. A prescription for pain medication may be given to you upon discharge.  Take your pain medication as prescribed, if needed.  If narcotic pain medicine is not needed, then you may take acetaminophen (Tylenol) or ibuprofen (Advil) as needed. 2. Take your usually prescribed medications unless otherwise directed 3. If you need a refill on your pain medication, please contact your pharmacy.  They will contact our office to request authorization.  Prescriptions will not be filled after 5pm or on week-ends. 4. You should eat very light the first 24 hours after surgery, such as soup, crackers, pudding, etc.  Resume your normal diet the day after surgery. 5. Most patients will experience some swelling and bruising in the breast.  Ice packs and a good support bra will help.  Swelling and bruising can take several days to resolve.  6. It is common to experience some constipation if taking pain medication after surgery.  Increasing fluid intake and taking a stool softener will usually help or prevent this problem from occurring.  A mild laxative (Milk of Magnesia or Miralax) should be taken according to package directions if there are no bowel movements after 48 hours. 7. Unless discharge instructions indicate otherwise, you may remove your bandages 24-48 hours after surgery, and you may shower at that time.  You may have steri-strips (small skin tapes) in place directly over the incision.  These strips should be left on the skin for 7-10 days.  If your surgeon used skin glue on the incision, you may shower in 24 hours.  The glue will flake off over the  next 2-3 weeks.  Any sutures or staples will be removed at the office during your follow-up visit. 8. ACTIVITIES:  You may resume regular daily activities (gradually increasing) beginning the next day.  Wearing a good support bra or sports bra minimizes pain and swelling.  You may have sexual intercourse when it is comfortable. a. You may drive when you no longer are taking prescription pain medication, you can comfortably wear a seatbelt, and you can safely maneuver your car and apply brakes. b. RETURN TO WORK:  ______________________________________________________________________________________ 9. You should see your doctor in the office for a follow-up appointment approximately two weeks after your surgery.  Your doctors nurse will typically make your follow-up appointment when she calls you with your pathology report.  Expect your pathology report 2-3 business days after your surgery.  You may call to check if you do not hear from [place] after three days. 10. OTHER INSTRUCTIONS: _______________________________________________________________________________________________ _____________________________________________________________________________________________________________________________________ _____________________________________________________________________________________________________________________________________ _____________________________________________________________________________________________________________________________________  WHEN TO CALL YOUR DOCTOR: 1. Fever over 101.0 2. Nausea and/or vomiting. 3. Extreme swelling or bruising. 4. Continued bleeding from incision. 5. Increased pain, redness, or drainage from the incision.  The clinic staff is available to answer your questions during regular business hours.  Please dont hesitate to call and ask to speak to one of the nurses for clinical concerns.  If you have a medical emergency, go to the nearest  emergency room or call 911.  A surgeon from [facility] is always on call at the hospital.  For further questions, please visit [email or web address] °Office Phone Number [phone] ° °BREAST BIOPSY/ PARTIAL MASTECTOMY: POST OP INSTRUCTIONS ° °Always review your discharge instruction sheet given to you by the facility where your surgery was performed. ° °IF YOU HAVE DISABILITY OR FAMILY LEAVE FORMS, YOU MUST BRING THEM TO THE OFFICE FOR PROCESSING.  DO NOT GIVE THEM TO YOUR DOCTOR. ° °11. A prescription for pain medication may be given to you upon discharge.  Take your pain medication as prescribed, if needed.  If narcotic pain medicine is not needed, then you may take acetaminophen (Tylenol) or ibuprofen (Advil) as needed. °12. Take your usually prescribed medications unless otherwise directed °13. If you need a refill on your pain medication, please contact your pharmacy.  They will contact our office to request authorization.  Prescriptions will not be filled after 5pm or on week-ends. °14. You should eat very light the first 24 hours after surgery, such as soup, crackers, pudding, etc.  Resume your normal diet the day after surgery. °15. Most patients will experience some swelling and bruising in the breast.  Ice packs and a good support bra will help.  Swelling and bruising can take several days to resolve.  °16. It is common to experience some constipation if taking pain medication after surgery.  Increasing fluid intake and taking a stool softener will usually help or prevent this problem from occurring.  A mild laxative (Milk of Magnesia or Miralax) should be taken according to package directions if there are no bowel movements after 48 hours. °17. Unless discharge instructions indicate otherwise, you may remove your bandages 24-48 hours after surgery, and you may shower at that time.  You may have steri-strips (small skin tapes) in place directly over the  incision.  These strips should be left on the skin for 7-10 days.  If your surgeon used skin glue on the incision, you may shower in 24 hours.  The glue will flake off over the next 2-3 weeks.  Any sutures or staples will be removed at the office during your follow-up visit. °18. ACTIVITIES:  You may resume regular daily activities (gradually increasing) beginning the next day.  Wearing a good support bra or sports bra minimizes pain and swelling.  You may have sexual intercourse when it is comfortable. °a. You may drive when you no longer are taking prescription pain medication, you can comfortably wear a seatbelt, and you can safely maneuver your car and apply brakes. °b. RETURN TO WORK:  ______________________________________________________________________________________ °19. You should see your doctor in the office for a follow-up appointment approximately two weeks after your surgery.  Your doctor’s nurse will typically make your follow-up appointment when she calls you with your pathology report.  Expect your pathology report 2-3 business days after your surgery.  You may call to check if you do not hear from us after three days. °20. OTHER INSTRUCTIONS: _______________________________________________________________________________________________ _____________________________________________________________________________________________________________________________________ °_____________________________________________________________________________________________________________________________________ °_____________________________________________________________________________________________________________________________________ ° °WHEN TO CALL YOUR DOCTOR: °6. Fever over 101.0 °7. Nausea and/or vomiting. °8. Extreme swelling or bruising. °9. Continued bleeding from incision. °10. Increased pain, redness, or drainage from the incision. ° °The clinic staff is available to answer your questions  during regular business hours.  Please don’t hesitate to call and ask to speak to one of the nurses for clinical concerns.  If you have a medical emergency, go to the nearest emergency room or call 911.  A surgeon from [facility] is always on call at the hospital. ° °For further questions, please visit [email or web address]  ° ° °  Post Anesthesia Home Care Instructions ° °Activity: °Get plenty of rest for the remainder of the day. A responsible adult should stay with you for 24 hours following the procedure.  °For the next 24 hours, DO NOT: °-Drive a car °-Operate machinery °-Drink alcoholic beverages °-Take any medication unless instructed by your physician °-Make any legal decisions or sign important papers. ° °Meals: °Start with liquid foods such as gelatin or soup. Progress to regular foods as tolerated. Avoid greasy, spicy, heavy foods. If nausea and/or vomiting occur, drink only clear liquids until the nausea and/or vomiting subsides. Call your physician if vomiting continues. ° °Special Instructions/Symptoms: °Your throat may feel dry or sore from the anesthesia or the breathing tube placed in your throat during surgery. If this causes discomfort, gargle with warm salt water. The discomfort should disappear within 24 hours. ° °If you had a scopolamine patch placed behind your ear for the management of post- operative nausea and/or vomiting: ° °1. The medication in the patch is effective for 72 hours, after which it should be removed.  Wrap patch in a tissue and discard in the trash. Wash hands thoroughly with soap and water. °2. You may remove the patch earlier than 72 hours if you experience unpleasant side effects which may include dry mouth, dizziness or visual disturbances. °3. Avoid touching the patch. Wash your hands with soap and water after contact with the patch. °  ° °

## 2014-05-09 NOTE — Progress Notes (Signed)
Pt here for surgery but forgot to go to breast center for needle loc prior to coming here. Call made to breast center who says they can still work her in if she arrives quickly. Spoke with friend (in lobby) who states they have no transportation (someone dropped them off and is coming after surgery for pick up). Manager Sherron Monday, RN) called security office to request transportation. Lynita Lombard, NT will accompany pt to breast center and request phone call when procedure complete for transportation back to surgery center. Pt understands and in agreement with plan. (Pt states "I forgot I was supposed to go there first.")

## 2014-05-09 NOTE — Anesthesia Postprocedure Evaluation (Signed)
  Anesthesia Post-op Note  Patient: Kathryn Haley  Procedure(s) Performed: Procedure(s): BRACKETED WIRE LOCALIZED LEFT BREAST LUMPECTOMY WITH LEFT  AXILLARY SENTINEL LYMPH NODE BX (Left)  Patient Location: PACU  Anesthesia Type: General   Level of Consciousness: awake, alert  and oriented  Airway and Oxygen Therapy: Patient Spontanous Breathing  Post-op Pain: mild  Post-op Assessment: Post-op Vital signs reviewed  Post-op Vital Signs: Reviewed  Last Vitals:  Filed Vitals:   05/09/14 1450  BP:   Pulse: 63  Temp:   Resp: 17    Complications: No apparent anesthesia complications

## 2014-05-09 NOTE — Anesthesia Preprocedure Evaluation (Signed)
Anesthesia Evaluation  Patient identified by MRN, date of birth, ID band Patient awake    Reviewed: Allergy & Precautions, NPO status , Patient's Chart, lab work & pertinent test results  Airway Mallampati: II  TM Distance: >3 FB Neck ROM: Full    Dental  (+) Teeth Intact, Dental Advisory Given, Implants   Pulmonary  breath sounds clear to auscultation        Cardiovascular hypertension, Rhythm:Regular Rate:Normal     Neuro/Psych    GI/Hepatic   Endo/Other    Renal/GU      Musculoskeletal   Abdominal   Peds  Hematology   Anesthesia Other Findings   Reproductive/Obstetrics                             Anesthesia Physical Anesthesia Plan  ASA: III  Anesthesia Plan: General   Post-op Pain Management: MAC Combined w/ Regional for Post-op pain   Induction:   Airway Management Planned: LMA  Additional Equipment:   Intra-op Plan:   Post-operative Plan:   Informed Consent: I have reviewed the patients History and Physical, chart, labs and discussed the procedure including the risks, benefits and alternatives for the proposed anesthesia with the patient or authorized representative who has indicated his/her understanding and acceptance.   Dental advisory given  Plan Discussed with: CRNA and Anesthesiologist  Anesthesia Plan Comments:         Anesthesia Quick Evaluation

## 2014-05-10 ENCOUNTER — Encounter (HOSPITAL_BASED_OUTPATIENT_CLINIC_OR_DEPARTMENT_OTHER): Payer: Self-pay | Admitting: General Surgery

## 2014-05-10 NOTE — Addendum Note (Signed)
Addendum  created 05/10/14 1128 by Tawni Millers, CRNA   Modules edited: Charges VN

## 2014-05-16 ENCOUNTER — Encounter: Payer: Self-pay | Admitting: Oncology

## 2014-05-16 ENCOUNTER — Telehealth: Payer: Self-pay | Admitting: *Deleted

## 2014-05-16 NOTE — Telephone Encounter (Signed)
Received oncotype order. Requisition sent to pathology. Received by Alyse Low. PAC sent to [facility].

## 2014-05-18 ENCOUNTER — Telehealth: Payer: Self-pay | Admitting: *Deleted

## 2014-05-18 NOTE — Telephone Encounter (Signed)
TC from [facility] regarding oncotype order request. They have not received the specimen yet. Transferred call to Bary Castilla, RN Navigator

## 2014-05-25 ENCOUNTER — Encounter: Payer: Self-pay | Admitting: *Deleted

## 2014-05-25 NOTE — Progress Notes (Unsigned)
Received oncotype score of 8/6%.  Copy to medical records to scan and copy to Dr. Jana Hakim.

## 2014-05-26 ENCOUNTER — Encounter (HOSPITAL_COMMUNITY): Payer: Self-pay

## 2014-05-26 ENCOUNTER — Telehealth: Payer: Self-pay | Admitting: *Deleted

## 2014-05-26 NOTE — Telephone Encounter (Signed)
Spoke with patient and informed her of the oncotype results.  She is so happy that she will not need chemotherapy.  Informed her she would be getting a call to schedule a follow up with Dr. Pablo Ledger.  She verbalized understanding.

## 2014-06-02 NOTE — Progress Notes (Signed)
Location of Breast Cancer:Left upper-outer quadrant  Histology per Pathology Report:05/09/2014 AL DIAGNOSIS Diagnosis 1. Breast, lumpectomy, left - INVASIVE GRADE II LOBULAR CARCINOMA SPANNING, 1.3 CM IN GREATEST DIMENSION. - LOBULAR CARCINOMA IN SITU WITH CALCIFICATIONS. - INVASIVE LOBULAR CARCINOMA IS 0.2 CM TO SUPERIOR MARGIN (CLOSEST MARGIN). - OTHER MARGINS ARE NEGATIVE. - SEE ONCOLOGY TEMPLATE. 2. Lymph node, sentinel, biopsy, left axillary #1 - ONE BENIGN LYMPH NODE WITH NO TUMOR SEEN (0/1) - SEE COMMENT. 3. Lymph node, sentinel, biopsy, left axillary #2 Diagnosis(continued) - ONE BENIGN LYMPH NODE WITH NO TUMOR SEEN (0/1) - SEE COMMENT. 4. Lymph node, sentinel, biopsy, left axillary #3 - ONE BENIGN LYMPH NODE WITH NO TUMOR SEEN (0/1) - SEE COMMENT. 5. Lymph node, sentinel, biopsy, left axillary #4 - ONE BENIGN LYMPH NODE WITH NO TUMOR SEEN (0/1)  Receptor Status: ER(+), PR (+), Her2-neu (-)  Breast irregularities found on screening mammogram confirmed by biopsy on 04/04/14.Prior biopsies were found to be negative.  Past/Anticipated interventions by surgeon, if any:05/09/14  BRACKETED [place] LUMPECTOMY WITH LEFT AXILLARY SENTINEL LYMPH NODE  Past/Anticipated interventions by medical oncology, if any: Chemotherapy   Lymphedema issues, if any:No  Pain issues, if any:No   SAFETY ISSUES:  Prior radiation? No  Pacemaker/ICD?No  Possible current pregnancy?No,post-menopausal  Is the patient on methotrexate? No  Current Complaints / other details:Separated, lives alone.Retired from [employer]. In [place]..first live birth age  30.GX P3.Last menstrual period age 74.No hormone replacement therapy.  2 daughters diagnosed with breast cancer in their early 22's. No smoking history, alcohol or illicit drugs. Advance Directive completed. Allergies:decadron    Arlyss Repress, RN 06/02/2014,3:20 PM

## 2014-06-08 ENCOUNTER — Ambulatory Visit
Admission: RE | Admit: 2014-06-08 | Discharge: 2014-06-08 | Disposition: A | Discharge status: Home / Self Care | Payer: Medicare Other | Source: Ambulatory Visit | Attending: Radiation Oncology | Admitting: Radiation Oncology

## 2014-06-08 ENCOUNTER — Telehealth: Payer: Self-pay | Admitting: Oncology

## 2014-06-08 ENCOUNTER — Encounter: Payer: Self-pay | Admitting: Radiation Oncology

## 2014-06-08 ENCOUNTER — Ambulatory Visit (HOSPITAL_BASED_OUTPATIENT_CLINIC_OR_DEPARTMENT_OTHER): Payer: Medicare Other | Admitting: Oncology

## 2014-06-08 VITALS — BP 145/86 | HR 59 | Temp 98.4°F | Wt 165.0 lb

## 2014-06-08 VITALS — BP 161/72 | HR 64 | Temp 97.9°F | Resp 18 | Ht 65.0 in | Wt 164.6 lb

## 2014-06-08 DIAGNOSIS — C50412 Malignant neoplasm of upper-outer quadrant of left female breast: Secondary | ICD-10-CM

## 2014-06-08 DIAGNOSIS — Z170 Estrogen receptor positive status [ER+]: Secondary | ICD-10-CM

## 2014-06-08 DIAGNOSIS — M545 Low back pain: Secondary | ICD-10-CM

## 2014-06-08 DIAGNOSIS — G8929 Other chronic pain: Secondary | ICD-10-CM

## 2014-06-08 DIAGNOSIS — R7309 Other abnormal glucose: Secondary | ICD-10-CM

## 2014-06-08 DIAGNOSIS — Z803 Family history of malignant neoplasm of breast: Secondary | ICD-10-CM

## 2014-06-08 MED ORDER — VITAMIN D 1000 UNITS PO TABS: 1000.0000 [IU] | ORAL_TABLET | Freq: Every day | ORAL | Status: AC

## 2014-06-08 MED ORDER — ANASTROZOLE 1 MG PO TABS: 1.0000 mg | ORAL_TABLET | Freq: Every day | ORAL | Status: DC

## 2014-06-08 NOTE — Progress Notes (Signed)
[place]  Telephone:([phone] Fax:([phone]) [phone]     ID: Kathryn Haley DOB: [birth year]  MR#: [id number]  PQD#:[id number]  Patient Care Team: Patria Mane, MD as PCP - General (Family Medicine) Excell Seltzer, MD as Consulting Physician (General Surgery) Chauncey Cruel, MD as Consulting Physician (Oncology) Thea Silversmith, MD as Consulting Physician (Radiation Oncology) Mauro Kaufmann, RN as Registered Nurse Rockwell Germany, RN as Registered Nurse PCP: Patria Mane, MD OTHER MD: Nicki Reaper McDiarmid M.D., Dr London Pepper  CHIEF COMPLAINT: Estrogen receptor positive lobular breast cancer  CURRENT TREATMENT: Awaiting adjuvant radiation   BREAST CANCER HISTORY: From the original intake note:  The patient had routine breast cancer screening at [facility] 02/24/2014. This showed the breast density to be category B. Some irregularities in the left breast appeared suspicious and the patient was recalled for unilateral left diagnostic mamm[facility] March 11 2014. This showed scar tissue in the upper outer left breast corresponding to an area of surgical scar. (The patient tells me she had 3 cysts removed remotely from her left breast, but were benign). The patient has a history of prior benign excisional left breast biopsy. There were also 3 groups of calcifications in a linear fashion spanning an area of 4.9 cm.  Biopsy of the left breast upper outer quadrant 04/04/2014 showed ([id number]) invasive lobular carcinoma (E-cadherin negative) grade 2, estrogen receptor 99% positive, progesterone receptor 44% positive, both with strong staining intensity, with an MIB-1 of 16%, and no HER-2 amplification, the signals ratio being 1.06 and the number per cell 1.80.  On 04/13/2014 the patient underwent bilateral breast MRI showing in the left breast at the 3:00 location an area of irregular clumped nodular enhancement measuring 4.5 cm. There was clip  artifact at the anterior aspect of this. There were no other areas of concern in the left breast, and no abnormal appearing lymph nodes. The right breast was unremarkable.  Her subsequent history is as detailed below  INTERVAL HISTORY: Ms. Kathryn Haley") returns today for follow-up of her breast cancer. Since her last visit here she underwent left lumpectomy and sentinel lymph node sampling 05/09/2014. The final pathology ([id number]) showed an invasive lobular carcinoma, grade 2, measuring 1.3 cm. All 4 sentinel lymph nodes were clear. Margins were negative. Repeat HER-2 was again negative. An Oncotype DX score of 8 predicted an outside the breast risk of recurrence within 10 years of 6% if the patient's only systemic therapy was tamoxifen for 5 years.   In addition the patient met with our genetics counselor but the patient was not sure why she was there or if she wanted genetic counseling. The suggestion was made that one of the patient's daughters, who were diagnosed with breast cancer in their 66s, be tested or their former tests be updated.  Finally Kathryn Haley met today with radiation oncology and they discussed the pros and cons of proceeding with adjuvant radiation. The patient decided to forgo that option. She is here today to discuss antiestrogen therapy  REVIEW OF SYSTEMS: Kathryn Haley did well with her surgery. She still feels a little bit of tightness in the left breast area, but no shooting pains or significant soreness. She is not taking any analgesics. She feels that her skin looks better since she had the surgery. She has a little bit of prediabetes that she is concerned about, and some back pain which is chronic. Otherwise a detailed review of systems today was stable  PAST  MEDICAL HISTORY: Past Medical History  Diagnosis Date  . Hypercholesteremia   . Hypertension   . Breast cancer of upper-outer quadrant of left female breast 04/11/2014  . Breast cancer   . Arthritis   . Anxiety     . Urinary frequency   . Heart murmur     echo 2013-mild regurg-aortic,mitral,tricu  . Family history of breast cancer     PAST SURGICAL HISTORY: Past Surgical History  Procedure Laterality Date  . Tonsillectomy    . Breast surgery      lt breast bx x3-negative  . Eye surgery      both cataracts  . Colonoscopy    . Breast lumpectomy with needle localization and axillary sentinel lymph node bx Left 05/09/2014    Procedure: BRACKETED WIRE LOCALIZED LEFT BREAST LUMPECTOMY WITH LEFT  AXILLARY SENTINEL LYMPH NODE BX;  Surgeon: Excell Seltzer, MD;  Location: [place];  Service: General;  Laterality: Left;    FAMILY HISTORY Family History  Problem Relation Age of Onset  . Brain cancer Sister   . Breast cancer Daughter   . Breast cancer Daughter     youngest daughter possibly had genetic testing   patient's father died from a heart attack at age 46. The patient's mother died from heart failure at age 34. The patient had one brother, 2 sisters. One of her sisters died from melanoma metastatic to the brain in 2007 area the patient has 3 daughters, 2 of them were diagnosed with breast cancer around the age of 88. They both live in [place] and both are doing well. She does not know whether they were genetically tested. There is no other history of breast or ovarian cancer in the family to the patient's knowledge.  GYNECOLOGIC HISTORY:  No LMP recorded. Patient is postmenopausal. Menarche age 43, first live birth age 44, the patient is [place] P3. She stopped having periods around age 50. She did not take hormone replacement.  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 She is separated and lives alone, with no pets. She has no family in this area other than a nephew, Alejandro Mulling, who lives 3 hours away the patient's children are well in the [place] who lives in [place] and is disabled; Iona Beard a [place] who lives in [place] and  works for [employer]; and Arnette Schaumann, who also lives in [place] and is a homemaker. Kathryn Haley and Kathryn Haley are the 2 daughters who had breast cancer in their early 12s the patient attends a local [place]: [place] place, and being updated through a lawyer per the patient. In case of an accident she requests we call Elon Spanner , close friend, at [phone]   HEALTH MAINTENANCE: History  Substance Use Topics  . Smoking status: Never Smoker   . Smokeless tobacco: Not on file  . Alcohol Use: No     Colonoscopy: In [place], 2008  PAP: 2014  Bone density: Never  Lipid panel:  Allergies  Allergen Reactions  . Decadron [Dexamethasone] Anaphylaxis, Swelling and Rash    Swelling of lips and tongue  . Other Anaphylaxis    Patient had antibiotic shot in a MD office , does not know what name of drug is.    Current Outpatient Prescriptions  Medication Sig Dispense Refill  . amLODipine (NORVASC) 5 MG tablet Take 5 mg by mouth at bedtime.     Marland Kitchen  anastrozole (ARIMIDEX) 1 MG tablet Take 1 tablet (1 mg total) by mouth daily. 90 tablet 4  . cholecalciferol (VITAMIN D) 1000 UNITS tablet Take 1 tablet (1,000 Units total) by mouth daily. 100 tablet 12  . rosuvastatin (CRESTOR) 10 MG tablet Take 10 mg by mouth every evening.     . verapamil (VERELAN PM) 240 MG 24 hr capsule Take 240 mg by mouth every morning.      No current facility-administered medications for this visit.    OBJECTIVE: Middle-aged African-American woman in no acute distress Filed Vitals:   06/08/14 1535  BP: 161/72  Pulse: 64  Temp: 97.9 F (36.6 C)  Resp: 18     Body mass index is 27.39 kg/(m^2).    ECOG FS:1 - Symptomatic but completely ambulatory   LAB RESULTS:  CMP     Component Value Date/Time   NA 142 04/20/2014 0803   NA 142 05/28/2011 0508   K 3.6 04/20/2014 0803   K 4.0 05/28/2011 0508   CL 107 05/28/2011 0508   CO2 25 04/20/2014 0803   CO2 26 05/28/2011 0508    GLUCOSE 100 04/20/2014 0803   GLUCOSE 99 05/28/2011 0508   BUN 15.5 04/20/2014 0803   BUN 19 05/28/2011 0508   CREATININE 0.8 04/20/2014 0803   CREATININE 0.95 05/28/2011 0508   CALCIUM 9.8 04/20/2014 0803   CALCIUM 9.7 05/28/2011 0508   PROT 7.3 04/20/2014 0803   PROT 6.9 05/28/2011 0508   ALBUMIN 3.7 04/20/2014 0803   ALBUMIN 3.5 05/28/2011 0508   AST 14 04/20/2014 0803   AST 14 05/28/2011 0508   ALT 12 04/20/2014 0803   ALT 14 05/28/2011 0508   ALKPHOS 109 04/20/2014 0803   ALKPHOS 104 05/28/2011 0508   BILITOT 0.41 04/20/2014 0803   BILITOT 0.3 05/28/2011 0508   GFRNONAA 59* 05/28/2011 0508   GFRAA 69* 05/28/2011 0508    INo results found for: SPEP, UPEP  Lab Results  Component Value Date   WBC 5.7 04/20/2014   NEUTROABS 2.8 04/20/2014   HGB 11.7 04/20/2014   HCT 36.7 04/20/2014   MCV 86.6 04/20/2014   PLT 293 04/20/2014      Chemistry      Component Value Date/Time   NA 142 04/20/2014 0803   NA 142 05/28/2011 0508   K 3.6 04/20/2014 0803   K 4.0 05/28/2011 0508   CL 107 05/28/2011 0508   CO2 25 04/20/2014 0803   CO2 26 05/28/2011 0508   BUN 15.5 04/20/2014 0803   BUN 19 05/28/2011 0508   CREATININE 0.8 04/20/2014 0803   CREATININE 0.95 05/28/2011 0508      Component Value Date/Time   CALCIUM 9.8 04/20/2014 0803   CALCIUM 9.7 05/28/2011 0508   ALKPHOS 109 04/20/2014 0803   ALKPHOS 104 05/28/2011 0508   AST 14 04/20/2014 0803   AST 14 05/28/2011 0508   ALT 12 04/20/2014 0803   ALT 14 05/28/2011 0508   BILITOT 0.41 04/20/2014 0803   BILITOT 0.3 05/28/2011 0508       No results found for: LABCA2  No components found for: OZYYQ825  No results for input(s): INR in the last 168 hours.  Urinalysis    Component Value Date/Time   COLORURINE YELLOW 05/27/2011 1706   APPEARANCEUR CLEAR 05/27/2011 1706   LABSPEC 1.019 05/27/2011 1706   PHURINE 6.0 05/27/2011 1706   GLUCOSEU NEGATIVE 05/27/2011 1706   HGBUR NEGATIVE 05/27/2011 1706   BILIRUBINUR  NEGATIVE 05/27/2011 1706   KETONESUR NEGATIVE  05/27/2011 1706   PROTEINUR 100* 05/27/2011 1706   UROBILINOGEN 0.2 05/27/2011 1706   NITRITE NEGATIVE 05/27/2011 1706   LEUKOCYTESUR SMALL* 05/27/2011 1706    STUDIES: No results found.  ASSESSMENT: 74 y.o. Kathryn Haley woman status post left breast biopsy 04/04/2014 for a clinical T2 N0, stage IIA invasive lobular carcinoma, estrogen receptor 99% positive, progesterone receptor 44% positive, with an MIB-1 of 16% and no HER-2 amplification.  (1) status post left lumpectomy and sentinel lymph node sampling 05/09/2014 for a pT1c pN0, stage IA invasive lobular carcinoma, grade 2, with repeat HER-2 again negative. Margins were clear  (2) Oncotype DX score of 8 predicts a risk of recurrence outside the breast of 6% within 10 years if the patient's only systemic therapy is tamoxifen for 5 years. It also predicts no benefit from chemotherapy.  (3) the patient opted against adjuvant radiation  (4) anastrozole started May 2016  (5) updated genetics testing of the patient's daughters suggested  PLAN: I spent approximately 40 minutes with Mrs. Ross reviewing the results of her pathology and Oncotype. She understands her breast cancer to her not to be quite small, about a half an inch, that there was no lymph node involvement, and that the margins were good. This was a low risk cancer and we have good data that in women over 70 so long as they take anti-estrogens for 5 years omitting radiation to the breast does not affect survival. She discussed this extensively with Dr. Pablo Ledger and the patient decided against radiation. We are all comfortable with that decision.  We also discussed her Oncotype, which confirms little to no benefit from chemotherapy and also predicts a very good long-term prognosis so long as she is able to tolerate anti-estrogens for 5 years.  We then discussed the difference between tamoxifen and the aromatase inhibitor. Kannon has a  very good understanding of the possible toxicities, side effects and complications of these agents. She would prefer to try the anastrozole and I went ahead and placed at the prescription for her today.  The main concern with this medication, of course, is worsening of bone density problems. We are going to obtain a bone density at the breast Center sometime in July. Avila will see me and month or so after that. If she has significant osteopenia or osteoporosis we will consider adding denosumab or zolendronate.  Ms. Bordeaux has a good understanding of the overall plan. She agrees with it. She knows the goal of treatment in her case is cure. She will call with any problems that may develop before her next visit here.  Chauncey Cruel, MD   06/08/2014 4:56 PM Medical Oncology and Hematology [facility] [address] [place], [place] [address] Tel. [phone]    Fax. [phone]

## 2014-06-08 NOTE — Progress Notes (Signed)
Please see the Nurse Progress Note in the MD Initial Consult Encounter for this patient. 

## 2014-06-08 NOTE — Telephone Encounter (Signed)
Gave avs & calendar for July & September. °

## 2014-06-08 NOTE — Progress Notes (Signed)
   Department of Radiation Oncology  Phone:  [phone] Fax:        [phone]   Name: Kathryn Haley MRN: [id number]  DOB: 03-06-1940  Date: 06/08/2014  Follow Up Visit Note  Diagnosis: Breast cancer of upper-outer quadrant of left female breast   Staging form: Breast, AJCC 7th Edition     Clinical stage from 04/20/2014: Stage IIA (T2, N0, M0) - Unsigned       Staging comments: Staged at breast conference on 3.30.16      Pathologic stage from 05/12/2014: Stage IA (T1c, N0, cM0) - Signed by Enid Cutter, MD on 05/13/2014       Staging comments: Staged on final lumpectomy specimen by Dr. Avis Epley.  Interval History: Modest presents today for routine followup. She had a lumpectomy on 05/09/14 and this showed a 1.3 cm invasive lobular cancer with 0/4 lymph nodes positive. This was ER/PR + and oncotype has been sent. She will not require chemotherapy. She spoke with Dr. Jana Hakim regarding antiesttrogen therapy.  Physical Exam:  Filed Vitals:   06/08/14 1453  BP: 145/86  Pulse: 59  Temp: 98.4 F (36.9 C)  Weight: 165 lb (74.844 kg)   Incision is well healed in the lateral aspect of the left breast. She is alert and oriented.   IMPRESSION: Cissy is a 74 y.o. female T1N0 invasive lobular cancer of the left breast.  PLAN:  We discussed the elderly trials showing no survival benefit to radiation plus anti-estrogen therapy versus anti-estrogen alone.  She feels comfortable with her decision to proceed with anti estrogen therapy alone and will discuss that with Dr. Jana Hakim tomorrow. She will follow up with me on a prn basis.   I spoke to the patient today regarding her diagnosis and options for treatment. We discussed the equivalence in terms of survival and local failure between mastectomy and breast conservation. We discussed the role of radiation in decreasing local failures in patients who undergo lumpectomy. We discussed the low rates of failure in patients her age with these non aggressive  breast cancers. We discussed the randomized trials in elderly women showing equivalent survival when omitting raidiaton and taking an AI instead. We discussed the randomized trials showing a slight improvement in local control with the combination.We discussed the differences between systemic and local treatment. She would like to pursue antiestrogen therapy alone and will discuss that with Dr. Jana Hakim this afternoon. My follow up with her will be on a prn basis.    This document serves as a record of services personally performed by Thea Silversmith, MD. It was created on her behalf by Darcus Austin, a trained medical scribe. The creation of this record is based on the scribe's personal observations and the provider's statements to them. This document has been checked and approved by the attending provider.   Thea Silversmith, MD

## 2014-06-28 ENCOUNTER — Telehealth: Payer: Self-pay | Admitting: *Deleted

## 2014-06-28 NOTE — Telephone Encounter (Signed)
VM message from patient received @ 1:08 pm. TC back to patient and spoke with her. She states she has not started taking her Anastrozole yet as she had gone out of town. She wanted to know if it was ok if she starts it now, even if she goes out of town again. I advised her to go ahead and start, otherwise the delay will bea total of over 1 month. Advised pt to keep [facility] phone # with while she travels in case she has any questions. She verbalized understanding and will start tonight.

## 2014-07-18 ENCOUNTER — Other Ambulatory Visit: Payer: Self-pay | Admitting: *Deleted

## 2014-07-18 ENCOUNTER — Telehealth: Payer: Self-pay | Admitting: *Deleted

## 2014-07-18 NOTE — Telephone Encounter (Signed)
Pt called to this RN to state onset of symptoms " since starting the medication ".  Symptoms are getting progressively worse.  Pt states decreased appetite and energy as well as dizziness.  Kathryn Haley states over the weekend " I woke up in the middle of the night and the room was just spinning and I felt nauseated and all I could do was lay there and pray"  Kathryn Haley normally takes the medication at night.  Per discussion she will hold the medication this evening.   This RN will call her tomorrow with further recommendations.

## 2014-07-19 ENCOUNTER — Other Ambulatory Visit: Payer: Self-pay | Admitting: *Deleted

## 2014-07-20 ENCOUNTER — Telehealth: Payer: Self-pay | Admitting: Nurse Practitioner

## 2014-07-20 NOTE — Telephone Encounter (Signed)
Called patient and she is aware of her new appointment

## 2014-08-11 ENCOUNTER — Other Ambulatory Visit: Payer: Self-pay | Admitting: Oncology

## 2014-08-12 ENCOUNTER — Telehealth: Payer: Self-pay | Admitting: *Deleted

## 2014-08-12 ENCOUNTER — Other Ambulatory Visit: Payer: Self-pay | Admitting: *Deleted

## 2014-08-12 ENCOUNTER — Ambulatory Visit: Payer: Medicare Other | Admitting: Nurse Practitioner

## 2014-08-12 NOTE — Telephone Encounter (Signed)
Called to inquire why pt was not here for appt today. Pt said she had called 2 days ago to cancel appt because she was still having episodes of vertigo and could not drive. Pt said she had episodes in the past of vertigo but it got worse after starting on the anastrozole. Pt started anastrozole on June 28, 2014 and stopped on July 15, 2014 after having increased side effects of staggering, dizziness and loss of appetite. Pt went to her PCP on 07/12/14 to evaluate the vertigo. He gave her some exercises to do to relieve the vertigo and pt said these are helping. I told pt that I would call her with the rescheduled appt. Pt verbalized understanding. No further concerns.

## 2014-08-12 NOTE — Telephone Encounter (Signed)
Called pt to let her know the time and date of appt r/s for 08/17/14. Pt wrote this down and verbalized understanding.No further concerns. Message to be forwarded to [facility].

## 2014-08-15 ENCOUNTER — Telehealth: Payer: Self-pay | Admitting: Nurse Practitioner

## 2014-08-15 NOTE — Telephone Encounter (Signed)
Confirmed appointment for 07/27

## 2014-08-16 ENCOUNTER — Other Ambulatory Visit: Payer: Self-pay | Admitting: *Deleted

## 2014-08-16 DIAGNOSIS — C50412 Malignant neoplasm of upper-outer quadrant of left female breast: Secondary | ICD-10-CM

## 2014-08-17 ENCOUNTER — Other Ambulatory Visit (HOSPITAL_BASED_OUTPATIENT_CLINIC_OR_DEPARTMENT_OTHER): Payer: Medicare Other

## 2014-08-17 ENCOUNTER — Encounter: Payer: Self-pay | Admitting: Nurse Practitioner

## 2014-08-17 ENCOUNTER — Ambulatory Visit (HOSPITAL_BASED_OUTPATIENT_CLINIC_OR_DEPARTMENT_OTHER): Payer: Medicare Other | Admitting: Nurse Practitioner

## 2014-08-17 VITALS — BP 135/69 | HR 57 | Temp 98.0°F | Resp 18 | Ht 65.0 in | Wt 161.5 lb

## 2014-08-17 DIAGNOSIS — C50412 Malignant neoplasm of upper-outer quadrant of left female breast: Secondary | ICD-10-CM

## 2014-08-17 DIAGNOSIS — Z803 Family history of malignant neoplasm of breast: Secondary | ICD-10-CM

## 2014-08-17 DIAGNOSIS — G8929 Other chronic pain: Secondary | ICD-10-CM

## 2014-08-17 DIAGNOSIS — Z170 Estrogen receptor positive status [ER+]: Secondary | ICD-10-CM

## 2014-08-17 DIAGNOSIS — R42 Dizziness and giddiness: Secondary | ICD-10-CM

## 2014-08-17 DIAGNOSIS — M545 Low back pain: Secondary | ICD-10-CM | POA: Diagnosis not present

## 2014-08-17 DIAGNOSIS — R630 Anorexia: Secondary | ICD-10-CM

## 2014-08-17 LAB — CBC WITH DIFFERENTIAL/PLATELET
BASO%: 0.7 % (ref 0.0–2.0)
BASOS ABS: 0 10*3/uL (ref 0.0–0.1)
EOS%: 4.5 % (ref 0.0–7.0)
Eosinophils Absolute: 0.3 10*3/uL (ref 0.0–0.5)
HCT: 35.7 % (ref 34.8–46.6)
HGB: 11.8 g/dL (ref 11.6–15.9)
LYMPH#: 2.1 10*3/uL (ref 0.9–3.3)
LYMPH%: 38.4 % (ref 14.0–49.7)
MCH: 28.4 pg (ref 25.1–34.0)
MCHC: 33.1 g/dL (ref 31.5–36.0)
MCV: 85.9 fL (ref 79.5–101.0)
MONO#: 0.4 10*3/uL (ref 0.1–0.9)
MONO%: 7.9 % (ref 0.0–14.0)
NEUT%: 48.5 % (ref 38.4–76.8)
NEUTROS ABS: 2.7 10*3/uL (ref 1.5–6.5)
PLATELETS: 304 10*3/uL (ref 145–400)
RBC: 4.16 10*6/uL (ref 3.70–5.45)
RDW: 14 % (ref 11.2–14.5)
WBC: 5.5 10*3/uL (ref 3.9–10.3)

## 2014-08-17 LAB — COMPREHENSIVE METABOLIC PANEL (CC13)
ALK PHOS: 117 U/L (ref 40–150)
ALT: 13 U/L (ref 0–55)
ANION GAP: 9 meq/L (ref 3–11)
AST: 17 U/L (ref 5–34)
Albumin: 3.9 g/dL (ref 3.5–5.0)
BUN: 17.6 mg/dL (ref 7.0–26.0)
CALCIUM: 9.9 mg/dL (ref 8.4–10.4)
CO2: 25 mEq/L (ref 22–29)
Chloride: 110 mEq/L — ABNORMAL HIGH (ref 98–109)
Creatinine: 0.9 mg/dL (ref 0.6–1.1)
EGFR: 71 mL/min/{1.73_m2} — ABNORMAL LOW (ref >90)
GLUCOSE: 108 mg/dL (ref 70–140)
Potassium: 3.7 mEq/L (ref 3.5–5.1)
Sodium: 143 mEq/L (ref 136–145)
TOTAL PROTEIN: 7.4 g/dL (ref 6.4–8.3)
Total Bilirubin: 0.36 mg/dL (ref 0.20–1.20)

## 2014-08-17 MED ORDER — TAMOXIFEN CITRATE 20 MG PO TABS: 20.0000 mg | ORAL_TABLET | Freq: Every day | ORAL | Status: DC

## 2014-08-17 NOTE — Progress Notes (Signed)
[place]  [phone] Fax:(336) [phone]     ID: Kathryn Haley DOB: 1940-08-08  MR#: [id number]  VPX#:[id number]  Patient Care Team: Kathryn Mane, MD as PCP - General (Family Medicine) Kathryn Seltzer, MD as Consulting Physician (General Surgery) Kathryn Cruel, MD as Consulting Physician (Oncology) Kathryn Silversmith, MD as Consulting Physician (Radiation Oncology) Kathryn Kaufmann, RN as Registered Nurse Kathryn Germany, RN as Registered Nurse PCP: Kathryn Mane, MD OTHER MD: Kathryn Haley M.D., Kathryn Haley  CHIEF COMPLAINT: Estrogen receptor positive lobular breast cancer  CURRENT TREATMENT: Awaiting adjuvant radiation   BREAST CANCER HISTORY: From the original intake note:  The patient had routine breast cancer screening at [facility] 02/24/2014. This showed the breast density to be category B. Some irregularities in the left breast appeared suspicious and the patient was recalled for unilateral left [facility] March 11 2014. This showed scar tissue in the upper outer left breast corresponding to an area of surgical scar. (The patient tells me she had 3 cysts removed remotely from her left breast, but were benign). The patient has a history of prior benign excisional left breast biopsy. There were also 3 groups of calcifications in a linear fashion spanning an area of 4.9 cm.  Biopsy of the left breast upper outer quadrant 04/04/2014 showed ([id number]) invasive lobular carcinoma (E-cadherin negative) grade 2, estrogen receptor 99% positive, progesterone receptor 44% positive, both with strong staining intensity, with an MIB-1 of 16%, and no HER-2 amplification, the signals ratio being 1.06 and the number per cell 1.80.  On 04/13/2014 the patient underwent bilateral breast MRI showing in the left breast at the 3:00 location an area of irregular clumped nodular enhancement measuring 4.5 cm. There was clip  artifact at the anterior aspect of this. There were no other areas of concern in the left breast, and no abnormal appearing lymph nodes. The right breast was unremarkable.  Her subsequent history is as detailed below  INTERVAL HISTORY: Kathryn Haley") returns today for follow-up of her breast cancer. She started anastrozole on June 7th, but took herself off the drug on June 24th because of complaints of lack of appetite, nausea, and vertigo. She couldn't drive and would barely leave bed some days. She visited her PCP who gave her exercises to relieve the vertigo and suggested she start OTC meclizine. Her vertigo is happening less often now, and her appetite is slowly improving since stopping the anastrozole. She is down 4lb since her last visit.  REVIEW OF SYSTEMS: [name] denies fevers, chills, or changes in bowel or bladder habits. She is supplementing with glucerna and Boost Plus shakes on occasion. She has chronic back pain that is no worse. She has no shortness of breath, chest pain, cough, or palpitations. She has shooting pains to her breast occasionally at night. A detailed review of systems is otherwise stable.   PAST MEDICAL HISTORY: Past Medical History  Diagnosis Date  . Hypercholesteremia   . Hypertension   . Breast cancer of upper-outer quadrant of left female breast 04/11/2014  . Breast cancer   . Arthritis   . Anxiety   . Urinary frequency   . Heart murmur     echo 2013-mild regurg-aortic,mitral,tricu  . Family history of breast cancer     PAST SURGICAL HISTORY: Past Surgical History  Procedure Laterality Date  . Tonsillectomy    . Breast surgery      lt breast bx x3-negative  .  Eye surgery      both cataracts  . Colonoscopy    . Breast lumpectomy with needle localization and axillary sentinel lymph node bx Left 05/09/2014    Procedure: BRACKETED WIRE LOCALIZED LEFT BREAST LUMPECTOMY WITH LEFT  AXILLARY SENTINEL LYMPH NODE BX;  Surgeon: Kathryn Seltzer, MD;   Location: [place];  Service: General;  Laterality: Left;    FAMILY HISTORY Family History  Problem Relation Age of Onset  . Brain cancer Sister   . Breast cancer Daughter   . Breast cancer Daughter     youngest daughter possibly had genetic testing   patient's father died from a heart attack at age 83. The patient's mother died from heart failure at age 10. The patient had one brother, 2 sisters. One of her sisters died from melanoma metastatic to the brain in 2007 area the patient has 3 daughters, 2 of them were diagnosed with breast cancer around the age of 48. They both live in [place] and both are doing well. She does not know whether they were genetically tested. There is no other history of breast or ovarian cancer in the family to the patient's knowledge.  GYNECOLOGIC HISTORY:  No LMP recorded. Patient is postmenopausal. Menarche age 25, first live birth age 69, [name]. She stopped having periods around age 36. She did not take hormone replacement.  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 She is separated and lives alone, with no pets. She has no family in this area other than a nephew, Kathryn Haley, who lives 3 hours away the patient's children are well in the [place] who lives in [place] and is disabled; Kathryn Haley who lives in [place] and works for [employer] and [employer]; and Kathryn Haley, who also lives in [place] and is a homemaker. Kathryn Haley and Kathryn Haley are the 2 daughters who had breast cancer in their early 67s the patient attends a local [place]: In place, and being updated through a lawyer per the patient. In case of an accident she requests we call Kathryn Haley , close friend, at [phone]   HEALTH MAINTENANCE: History  Substance Use Topics  . Smoking status: Never Smoker   . Smokeless tobacco: Not on file  . Alcohol Use: No     Colonoscopy:  In [place], 2008  PAP: 2014  Bone density: Never  Lipid panel:  Allergies  Allergen Reactions  . Decadron [Dexamethasone] Anaphylaxis, Swelling and Rash    Swelling of lips and tongue  . Other Anaphylaxis    Patient had antibiotic shot in a MD office , does not know what name of drug is.    Current Outpatient Prescriptions  Medication Sig Dispense Refill  . amLODipine (NORVASC) 5 MG tablet Take 5 mg by mouth at bedtime.     . cholecalciferol (VITAMIN D) 1000 UNITS tablet Take 1 tablet (1,000 Units total) by mouth daily. 100 tablet 12  . rosuvastatin (CRESTOR) 10 MG tablet Take 10 mg by mouth every evening. Pt takes Brand name of this medication    . verapamil (VERELAN PM) 240 MG 24 hr capsule Take 240 mg by mouth every morning.     . tamoxifen (NOLVADEX) 20 MG tablet Take 1 tablet (20 mg total) by mouth daily. 30 tablet 5   No current facility-administered medications for this visit.    OBJECTIVE: Middle-aged African-American woman in  no acute distress Filed Vitals:   08/17/14 0949  BP: 135/69  Pulse: 57  Temp: 98 F (36.7 C)  Resp: 18     Body mass index is 26.88 kg/(m^2).    ECOG FS:1 - Symptomatic but completely ambulatory   Skin: warm, dry  HEENT: sclerae anicteric, conjunctivae pink, oropharynx clear. No thrush or mucositis.  Lymph Nodes: No cervical or supraclavicular lymphadenopathy  Lungs: clear to auscultation bilaterally, no rales, wheezes, or rhonci  Heart: regular rate and rhythm  Abdomen: round, soft, non tender, positive bowel sounds  Musculoskeletal: No focal spinal tenderness, no peripheral edema  Neuro: non focal, well oriented, positive affect  Breasts: left breast status post lumpectomy. No evidence of recurrent disease. Left axilla benign. Right breast unremarkable.  LAB RESULTS:  CMP     Component Value Date/Time   NA 143 08/17/2014 0919   NA 142 05/28/2011 0508   K 3.7 08/17/2014 0919   K 4.0 05/28/2011 0508   CL 107 05/28/2011 0508    CO2 25 08/17/2014 0919   CO2 26 05/28/2011 0508   GLUCOSE 108 08/17/2014 0919   GLUCOSE 99 05/28/2011 0508   BUN 17.6 08/17/2014 0919   BUN 19 05/28/2011 0508   CREATININE 0.9 08/17/2014 0919   CREATININE 0.95 05/28/2011 0508   CALCIUM 9.9 08/17/2014 0919   CALCIUM 9.7 05/28/2011 0508   PROT 7.4 08/17/2014 0919   PROT 6.9 05/28/2011 0508   ALBUMIN 3.9 08/17/2014 0919   ALBUMIN 3.5 05/28/2011 0508   AST 17 08/17/2014 0919   AST 14 05/28/2011 0508   ALT 13 08/17/2014 0919   ALT 14 05/28/2011 0508   ALKPHOS 117 08/17/2014 0919   ALKPHOS 104 05/28/2011 0508   BILITOT 0.36 08/17/2014 0919   BILITOT 0.3 05/28/2011 0508   GFRNONAA 59* 05/28/2011 0508   GFRAA 69* 05/28/2011 0508    INo results found for: SPEP, UPEP  Lab Results  Component Value Date   WBC 5.5 08/17/2014   NEUTROABS 2.7 08/17/2014   HGB 11.8 08/17/2014   HCT 35.7 08/17/2014   MCV 85.9 08/17/2014   PLT 304 08/17/2014      Chemistry      Component Value Date/Time   NA 143 08/17/2014 0919   NA 142 05/28/2011 0508   K 3.7 08/17/2014 0919   K 4.0 05/28/2011 0508   CL 107 05/28/2011 0508   CO2 25 08/17/2014 0919   CO2 26 05/28/2011 0508   BUN 17.6 08/17/2014 0919   BUN 19 05/28/2011 0508   CREATININE 0.9 08/17/2014 0919   CREATININE 0.95 05/28/2011 0508      Component Value Date/Time   CALCIUM 9.9 08/17/2014 0919   CALCIUM 9.7 05/28/2011 0508   ALKPHOS 117 08/17/2014 0919   ALKPHOS 104 05/28/2011 0508   AST 17 08/17/2014 0919   AST 14 05/28/2011 0508   ALT 13 08/17/2014 0919   ALT 14 05/28/2011 0508   BILITOT 0.36 08/17/2014 0919   BILITOT 0.3 05/28/2011 0508       No results found for: LABCA2  No components found for: [id number]  No results for input(s): INR in the last 168 hours.  Urinalysis    Component Value Date/Time   COLORURINE YELLOW 05/27/2011 1706   APPEARANCEUR CLEAR 05/27/2011 1706   LABSPEC 1.019 05/27/2011 1706   PHURINE 6.0 05/27/2011 1706   GLUCOSEU NEGATIVE 05/27/2011  1706   HGBUR NEGATIVE 05/27/2011 1706   BILIRUBINUR NEGATIVE 05/27/2011 1706   KETONESUR NEGATIVE 05/27/2011 1706   PROTEINUR 100*  05/27/2011 1706   UROBILINOGEN 0.2 05/27/2011 1706   NITRITE NEGATIVE 05/27/2011 1706   LEUKOCYTESUR SMALL* 05/27/2011 1706    STUDIES: No results found.  ASSESSMENT: 74 y.o. [place] woman status post left breast biopsy 04/04/2014 for a clinical T2 N0, stage IIA invasive lobular carcinoma, estrogen receptor 99% positive, progesterone receptor 44% positive, with an MIB-1 of 16% and no HER-2 amplification.  (1) status post left lumpectomy and sentinel lymph node sampling 05/09/2014 for a pT1c pN0, stage IA invasive lobular carcinoma, grade 2, with repeat HER-2 again negative. Margins were clear  (2) Oncotype DX score of 8 predicts a risk of recurrence outside the breast of 6% within 10 years if the patient's only systemic therapy is tamoxifen for 5 years. It also predicts no benefit from chemotherapy.  (3) the patient opted against adjuvant radiation  (4) anastrozole started 06/28/2014, stopped 07/15/2014.   (5) tamoxifen to begin August 2016  (6) updated genetics testing of the patient's daughters suggested  PLAN: Kathryn Haley and I spent 25 minutes discussing her experience on anastrozole. It is clear that she was not tolerating that drug well, and will need a switch to an alternative therapy. She is negative for a history of clots, so we will try tamoxifen next. We briefly reviewed the potential side effects of this drug, and I have sent a prescription to her pharmacy. Kathryn Haley intends to start this drug on 8/8, which is when she returns from her trip to [place].   Kathryn Haley will return in mid September for a follow up visit with Kathryn. Jana Hakim. At this time they will assess her tolerance of the tamoxifen and made any necessary adjustments. She understands and agrees with this plan. She knows the goal of treatment in her case is cure. She has been encouraged to call with any  issues that might arise before her next visit here.    Kathryn Panda, NP   08/17/2014 10:39 AM

## 2014-08-19 ENCOUNTER — Other Ambulatory Visit: Payer: Medicare Other

## 2014-09-20 ENCOUNTER — Other Ambulatory Visit: Payer: Self-pay | Admitting: Nurse Practitioner

## 2014-09-20 DIAGNOSIS — C50412 Malignant neoplasm of upper-outer quadrant of left female breast: Secondary | ICD-10-CM

## 2014-09-23 ENCOUNTER — Telehealth: Payer: Self-pay | Admitting: Oncology

## 2014-09-23 NOTE — Telephone Encounter (Signed)
Called and left a message to call for survivorship if she would like to do so

## 2014-09-27 ENCOUNTER — Telehealth: Payer: Self-pay | Admitting: *Deleted

## 2014-09-27 NOTE — Telephone Encounter (Signed)
Message left by pt stating " I am returning a call ". Noted message left by scheduling regarding an appointment for survivor ship clinic.  Message will be forwarding to [facility] in scheduling per her note.

## 2014-10-06 ENCOUNTER — Ambulatory Visit (HOSPITAL_BASED_OUTPATIENT_CLINIC_OR_DEPARTMENT_OTHER): Payer: Medicare Other | Admitting: Oncology

## 2014-10-06 ENCOUNTER — Telehealth: Payer: Self-pay | Admitting: Oncology

## 2014-10-06 VITALS — BP 140/71 | HR 62 | Temp 97.9°F | Resp 18 | Ht 65.0 in | Wt 163.1 lb

## 2014-10-06 DIAGNOSIS — C50412 Malignant neoplasm of upper-outer quadrant of left female breast: Secondary | ICD-10-CM | POA: Diagnosis not present

## 2014-10-06 NOTE — Progress Notes (Signed)
[place]  Telephone:(336) [phone] Fax:(336) [phone]     ID: Rozanna Box DOB: [birth year]  MR#: [id number]  HYQ#:[id number]  Patient Care Team: Patria Mane, MD as PCP - General (Family Medicine) Excell Seltzer, MD as Consulting Physician (General Surgery) Chauncey Cruel, MD as Consulting Physician (Oncology) Thea Silversmith, MD as Consulting Physician (Radiation Oncology) Mauro Kaufmann, RN as Registered Nurse Rockwell Germany, RN as Registered Nurse PCP: Patria Mane, MD OTHER MD: Nicki Reaper McDiarmid M.D., Dr London Pepper  CHIEF COMPLAINT: Estrogen receptor positive lobular breast cancer  CURRENT TREATMENT: Awaiting adjuvant radiation   BREAST CANCER HISTORY: From the original intake note:  The patient had routine breast cancer screening at [facility] 02/24/2014. This showed the breast density to be category B. Some irregularities in the left breast appeared suspicious and the patient was recalled for unilateral left diagnostic mammography at [facility] March 11 2014. This showed scar tissue in the upper outer left breast corresponding to an area of surgical scar. (The patient tells me she had 3 cysts removed remotely from her left breast, but were benign). The patient has a history of prior benign excisional left breast biopsy. There were also 3 groups of calcifications in a linear fashion spanning an area of 4.9 cm.  Biopsy of the left breast upper outer quadrant 04/04/2014 showed ([id number]) invasive lobular carcinoma (E-cadherin negative) grade 2, estrogen receptor 99% positive, progesterone receptor 44% positive, both with strong staining intensity, with an MIB-1 of 16%, and no HER-2 amplification, the signals ratio being 1.06 and the number per cell 1.80.  On 04/13/2014 the patient underwent bilateral breast MRI showing in the left breast at the 3:00 location an area of irregular clumped nodular enhancement measuring 4.5 cm. There was clip  artifact at the anterior aspect of this. There were no other areas of concern in the left breast, and no abnormal appearing lymph nodes. The right breast was unremarkable.  Her subsequent history is as detailed below  INTERVAL HISTORY: Ms. Reeva Davern") returns today for follow-up of her breast cancer. She started tamoxifen in June as planned. Within a couple of weeks she couldn't get out of bed. She develops severe vertigo. She was vomiting. She was very weak. She lost weight. She went off the medication after about 2 weeks in very soon after felt much better. She is now back to baseline. She does not think she can take any other antiestrogen medication.  REVIEW OF SYSTEMS: Ms. Barfuss is a little low back pain which is not more intense or persistent than before. She has some ear drainage at times. She is worried about anemia. She tells me she has prediabetes. She has some exercise equipment at home which she uses and she also has Silver sneakers. A detailed review of systems today was otherwise stable  PAST MEDICAL HISTORY: Past Medical History  Diagnosis Date  . Hypercholesteremia   . Hypertension   . Breast cancer of upper-outer quadrant of left female breast 04/11/2014  . Breast cancer   . Arthritis   . Anxiety   . Urinary frequency   . Heart murmur     echo 2013-mild regurg-aortic,mitral,tricu  . Family history of breast cancer     PAST SURGICAL HISTORY: Past Surgical History  Procedure Laterality Date  . Tonsillectomy    . Breast surgery      lt breast bx x3-negative  . Eye surgery      both cataracts  . Colonoscopy    .  Breast lumpectomy with needle localization and axillary sentinel lymph node bx Left 05/09/2014    Procedure: BRACKETED WIRE LOCALIZED LEFT BREAST LUMPECTOMY WITH LEFT  AXILLARY SENTINEL LYMPH NODE BX;  Surgeon: Excell Seltzer, MD;  Location: [place];  Service: General;  Laterality: Left;    FAMILY HISTORY Family History  Problem Relation  Age of Onset  . Brain cancer Sister   . Breast cancer Daughter   . Breast cancer Daughter     youngest daughter possibly had genetic testing   patient's father died from a heart attack at age 85. The patient's mother died from heart failure at age 43. The patient had one brother, 2 sisters. One of her sisters died from melanoma metastatic to the brain in 2007 area the patient has 3 daughters, 2 of them were diagnosed with breast cancer around the age of 42. They both live in [place] and both are doing well. She does not know whether they were genetically tested. There is no other history of breast or ovarian cancer in the family to the patient's knowledge.  GYNECOLOGIC HISTORY:  No LMP recorded. Patient is postmenopausal. Menarche age 74, first live birth age 74, the patient is [place] P3. She stopped having periods around age 74. She did not take hormone replacement.  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 She is separated and lives alone, with no pets. She has no family in this area other than a nephew, Alejandro Mulling, who lives 3 hours away the patient's children are well in the [place] who lives in [place] and is disabled; Iona Beard a [place] who lives in [place] and works for [employer] and [employer]; and Arnette Schaumann, who also lives in [place] and is a homemaker. [place] and Freda Munro are the 2 daughters who had breast cancer in their early 74s the patient attends a local [place]: In place, and being updated through a lawyer per the patient. In case of an accident she requests we call Elon Spanner , close friend, at [phone]   HEALTH MAINTENANCE: Social History  Substance Use Topics  . Smoking status: Never Smoker   . Smokeless tobacco: Not on file  . Alcohol Use: No     Colonoscopy: In [place], 2008  PAP: 2014  Bone density: Never  Lipid panel:  Allergies  Allergen Reactions  . Decadron  [Dexamethasone] Anaphylaxis, Swelling and Rash    Swelling of lips and tongue  . Other Anaphylaxis    Patient had antibiotic shot in a MD office , does not know what name of drug is.    Current Outpatient Prescriptions  Medication Sig Dispense Refill  . amLODipine (NORVASC) 5 MG tablet Take 5 mg by mouth at bedtime.     . cholecalciferol (VITAMIN D) 1000 UNITS tablet Take 1 tablet (1,000 Units total) by mouth daily. 100 tablet 12  . rosuvastatin (CRESTOR) 10 MG tablet Take 10 mg by mouth every evening. Pt takes Brand name of this medication    . tamoxifen (NOLVADEX) 20 MG tablet Take 1 tablet (20 mg total) by mouth daily. 30 tablet 5  . verapamil (VERELAN PM) 240 MG 24 hr capsule Take 240 mg by mouth every morning.      No current facility-administered medications for this visit.    OBJECTIVE: Middle-aged African-American woman who appears stated age 9 Vitals:   10/06/14 1130  BP: 140/71  Pulse: 62  Temp: 97.9 F (36.6 C)  Resp: 18     Body mass index is 27.14 kg/(m^2).    ECOG FS:1 - Symptomatic but completely ambulatory  Sclerae unicteric, EOMs intact Oropharynx clear, slightly dry No cervical or supraclavicular adenopathy Lungs no rales or rhonchi Heart regular rate and rhythm, 2/6 systolic murmur as previously noted Abd soft, nontender, positive bowel sounds MSK no focal spinal tenderness, no upper extremity lymphedema Neuro: nonfocal, well oriented, appropriate affect Breasts: The right breast is unremarkable. The left breast is status post lumpectomy. There is no evidence of local recurrence. Left axilla is benign. Skin: In the upper outer portion of the left breast, more in the upper anterior chest then in the breast itself, there is a subcutaneous nodule measuring 2-3 mm which is hard and feels like scar tissue. The patient tells me this is a "seed" and that her mother used to get the same problem. We should keep an eye on this in future exams.  LAB  RESULTS:  CMP     Component Value Date/Time   NA 143 08/17/2014 0919   NA 142 05/28/2011 0508   K 3.7 08/17/2014 0919   K 4.0 05/28/2011 0508   CL 107 05/28/2011 0508   CO2 25 08/17/2014 0919   CO2 26 05/28/2011 0508   GLUCOSE 108 08/17/2014 0919   GLUCOSE 99 05/28/2011 0508   BUN 17.6 08/17/2014 0919   BUN 19 05/28/2011 0508   CREATININE 0.9 08/17/2014 0919   CREATININE 0.95 05/28/2011 0508   CALCIUM 9.9 08/17/2014 0919   CALCIUM 9.7 05/28/2011 0508   PROT 7.4 08/17/2014 0919   PROT 6.9 05/28/2011 0508   ALBUMIN 3.9 08/17/2014 0919   ALBUMIN 3.5 05/28/2011 0508   AST 17 08/17/2014 0919   AST 14 05/28/2011 0508   ALT 13 08/17/2014 0919   ALT 14 05/28/2011 0508   ALKPHOS 117 08/17/2014 0919   ALKPHOS 104 05/28/2011 0508   BILITOT 0.36 08/17/2014 0919   BILITOT 0.3 05/28/2011 0508   GFRNONAA 59* 05/28/2011 0508   GFRAA 69* 05/28/2011 0508    INo results found for: SPEP, UPEP  Lab Results  Component Value Date   WBC 5.5 08/17/2014   NEUTROABS 2.7 08/17/2014   HGB 11.8 08/17/2014   HCT 35.7 08/17/2014   MCV 85.9 08/17/2014   PLT 304 08/17/2014      Chemistry      Component Value Date/Time   NA 143 08/17/2014 0919   NA 142 05/28/2011 0508   K 3.7 08/17/2014 0919   K 4.0 05/28/2011 0508   CL 107 05/28/2011 0508   CO2 25 08/17/2014 0919   CO2 26 05/28/2011 0508   BUN 17.6 08/17/2014 0919   BUN 19 05/28/2011 0508   CREATININE 0.9 08/17/2014 0919   CREATININE 0.95 05/28/2011 0508      Component Value Date/Time   CALCIUM 9.9 08/17/2014 0919   CALCIUM 9.7 05/28/2011 0508   ALKPHOS 117 08/17/2014 0919   ALKPHOS 104 05/28/2011 0508   AST 17 08/17/2014 0919   AST 14 05/28/2011 0508   ALT 13 08/17/2014 0919   ALT 14 05/28/2011 0508   BILITOT 0.36 08/17/2014 0919   BILITOT 0.3 05/28/2011 0508       No results found for: LABCA2  No components found for: [id number]  No results for input(s): INR in the last 168 hours.  Urinalysis    Component Value  Date/Time   COLORURINE YELLOW 05/27/2011 [place] 05/27/2011 1706  LABSPEC 1.019 05/27/2011 1706   PHURINE 6.0 05/27/2011 1706   GLUCOSEU NEGATIVE 05/27/2011 1706   HGBUR NEGATIVE 05/27/2011 1706   BILIRUBINUR NEGATIVE 05/27/2011 1706   KETONESUR NEGATIVE 05/27/2011 1706   PROTEINUR 100* 05/27/2011 1706   UROBILINOGEN 0.2 05/27/2011 1706   NITRITE NEGATIVE 05/27/2011 1706   LEUKOCYTESUR SMALL* 05/27/2011 1706    STUDIES: No results found.  ASSESSMENT: 74 y.o. [place] woman status post left breast biopsy 04/04/2014 for a clinical T2 N0, stage IIA invasive lobular carcinoma, estrogen receptor 99% positive, progesterone receptor 44% positive, with an MIB-1 of 16% and no HER-2 amplification.  (1) status post left lumpectomy and sentinel lymph node sampling 05/09/2014 for a pT1c pN0, stage IA invasive lobular carcinoma, grade 2, with repeat HER-2 again negative. Margins were clear  (2) Oncotype DX score of 8 predicts a risk of recurrence outside the breast of 6% within 10 years if the patient's only systemic therapy is tamoxifen for 5 years. It also predicts no benefit from chemotherapy.  (3) the patient opted against adjuvant radiation  (4) anastrozole started 06/28/2014, stopped 07/15/2014.   (5) tamoxifen begun August 2016, stopped after 2 weeks with significant side effects  (6) updated genetics testing of the patient's daughters suggested  PLAN: Jaleiah did her best to take anti-estrogens. She simply was not able to take them. She had unusual problems which may or may not have been directly related to the medications, but which in her mind are certainly due to them. The problems resolved after she went off the medications. At this point she just wants to be followed off treatment and that will be the plan.  Recall that she refused radiation. Accordingly she is at higher risk of local recurrence and she would be otherwise.  She will see [place] again late April after her  early mid-April mammography. She will have lab work a week before as well. In the meantime she tells me she wants to gain some weight and will continue exercising at home and with Silver sneakers.  She knows to call for any problems that may develop before her next visit here.   Chauncey Cruel, MD   10/06/2014 11:43 AM

## 2014-10-06 NOTE — Telephone Encounter (Signed)
Gave avs & calendar for April 2017. °

## 2014-10-07 ENCOUNTER — Telehealth: Payer: Self-pay | Admitting: *Deleted

## 2014-10-07 NOTE — Telephone Encounter (Signed)
Left vm for pt to return call to assess needs for 3 mon f/u. Contact information given.

## 2014-12-21 ENCOUNTER — Telehealth: Payer: Self-pay | Admitting: Oncology

## 2014-12-21 NOTE — Telephone Encounter (Signed)
Kathryn Haley called to say that she was unable to take tamoxifen for her breast cancer because it gave her vertigo.  She said she saw Dr. Jana Hakim in September and was told to stop taking it.  She is now wanting to ask Dr. Pablo Ledger if the cancer will spread since she is not taking any medication for the cancer.  Advised her to call Dr. Virgie Dad office to discuss this and also that her question will be forwarded to Dr. Pablo Ledger.  Ruie verbalized agreement.

## 2014-12-26 ENCOUNTER — Telehealth: Payer: Self-pay | Admitting: *Deleted

## 2014-12-26 NOTE — Telephone Encounter (Signed)
FYI  "I called twice last week and spent two days at home waiting for a return call I did not receive so I'm calling now.  I'm leaving this week (Th or Fr) for [place] and need to know am I going to be okay.  Should I see the doctor before I leave.  I kept getting vertigo and weight loss from the pills he ordered and stopped them.  Am I going to be okay." Denies any new lumps or symptoms just concerned.  Denies wanting to receive RT or begin a new AI for treatment.  This nurse advised she sounds like she is feeling well.  To call if any changes, new symptoms or change in her desire for RT or other treatment.  "I have dark spots, rash to my face at chin, neck that started two weeks ago.  Should I call my Dermatologist?  I use Triamcinolone acetonide which is helping it improve."  Denies new soaps, lotions.  Advised she call Dermatologist.

## 2015-01-06 ENCOUNTER — Telehealth: Payer: Self-pay | Admitting: Oncology

## 2015-01-06 ENCOUNTER — Other Ambulatory Visit: Payer: Self-pay | Admitting: *Deleted

## 2015-01-06 DIAGNOSIS — C50412 Malignant neoplasm of upper-outer quadrant of left female breast: Secondary | ICD-10-CM

## 2015-01-06 NOTE — Telephone Encounter (Signed)
Called and left a message with survivorship appointment °

## 2015-01-31 ENCOUNTER — Encounter: Payer: Self-pay | Admitting: Nurse Practitioner

## 2015-01-31 ENCOUNTER — Ambulatory Visit (HOSPITAL_BASED_OUTPATIENT_CLINIC_OR_DEPARTMENT_OTHER): Payer: Medicare Other | Admitting: Nurse Practitioner

## 2015-01-31 VITALS — BP 156/64 | HR 65 | Temp 98.1°F | Resp 17 | Ht 65.0 in | Wt 167.0 lb

## 2015-01-31 DIAGNOSIS — C50412 Malignant neoplasm of upper-outer quadrant of left female breast: Secondary | ICD-10-CM

## 2015-01-31 DIAGNOSIS — M2550 Pain in unspecified joint: Secondary | ICD-10-CM | POA: Diagnosis not present

## 2015-01-31 NOTE — Progress Notes (Signed)
CLINIC:  Cancer Survivorship   REASON FOR VISIT:  Routine follow-up post-treatment for a recent history of breast cancer.  BRIEF ONCOLOGIC HISTORY:    Breast cancer of upper-outer quadrant of left female breast ([place])   02/24/2014 Mammogram Left breast: irregularities necessitating further imaging   03/11/2014 Mammogram Left breast: 3 groups of calcifications in a linear fashion spanning an area of 4.9 cm.   04/04/2014 Initial Biopsy Left breast core needle bx ([facility]): invasive lobular carcinoma with LCIS, associated calcifications; ER+ (99%), PR+ (44%), HER2/neu negative (ratio 1.06), Ki67 16%.    04/13/2014 Breast MRI Left breast 3 o'clock location is a segmental area of irregular clumped nodular enhancement measuring 4.5 cm in maximal antero posterior dimension by 1.6 cm transversely by 1.4 cm craniocaudad dimension   04/20/2014 Clinical Stage Stage IIA: T2 N0    Procedure testing for daughters recommended   05/09/2014 Definitive Surgery Left lumpectomy/SLMB ([facility]): invasive lobular carcinoma, grade 2, with repeat HER-2 again negative. Margins were clear   05/09/2014 Pathologic Stage Stage IA: pT1c pN0   05/09/2014 Oncotype testing RS 8 (6% ROR)    Radiation Therapy pt declined   06/28/2014 - 09/21/2014 Anti-estrogen oral therapy Anastrozole 1 mg daily through 07/15/14 - discontinued due to side effects; Tamoxifen 20 mg daily 8/16 x 2 weeks - discontinued due to side effects.   01/31/2015 Survivorship Survivorship care plan visit completed    INTERVAL HISTORY:  Ms. Rhome presents to the [facility] today for our initial meeting to review her survivorship care plan detailing her treatment course for breast cancer, as well as monitoring long-term side effects of that treatment, education regarding health maintenance, screening, and overall wellness and health promotion.     Overall, Ms. Manter reports feeling quite well since her surgery in April 2016.  She attempted two trials of endocrine  therapy, first with anastrozole and then tamoxifen which were poorly tolerated due to symptoms of vertigo, anorexia, weight loss, and malaise.  She refused radiation therapy. She denies any mass or lesion in either breast. She denies headache, cough, shortness of breath. She has bone and joint pain related to her arthritis and does have some accompanying numbness / tingling in her right wrist which she has had since working for [employer] (repetitite injury).  She has a good appetite and denies any weight loss.    REVIEW OF SYSTEMS:  General: Fatigue as above. Denies fever, chills, unintentional weight loss, or night sweats.  HEENT: Denies visual changes, hearing loss, mouth sores or difficulty swallowing. Cardiac: Denies palpitations, chest pain, and lower extremity edema.  Respiratory: Denies wheeze or dyspnea on exertion.  Breast: Denies any new nodularity, masses, tenderness, nipple changes, or nipple discharge.  GI: Denies abdominal pain, constipation, diarrhea, nausea, or vomiting.  GU: Denies dysuria, hematuria, vaginal bleeding, vaginal discharge, or vaginal dryness.  Musculoskeletal: Generalized aches and pains related to arthritis, otherwise denies bone pain.  Neuro: Numbness and tingling in right hand which predates her cancer diagnosis. Denies recent fall. Skin: Denies rash, pruritis, or open wounds.  Psych: Denies depression, anxiety, insomnia, or memory loss.   A 14-point review of systems was completed and was negative, except as noted above.   ONCOLOGY TREATMENT TEAM:  1. Surgeon:  Dr. Excell Seltzer at [facility]  2. Medical Oncologist: Dr. Jana Hakim 3. Radiation Oncologist: Dr. Pablo Ledger    PAST MEDICAL/SURGICAL HISTORY:  Past Medical History  Diagnosis Date  . Hypercholesteremia   . Hypertension   . Breast cancer of upper-outer  quadrant of left female breast ([place]) 04/11/2014  . Breast cancer ([place])   . Arthritis   . Anxiety   . Urinary frequency   . Heart  murmur     echo 2013-mild regurg-aortic,mitral,tricu  . Family history of breast cancer    Past Surgical History  Procedure Laterality Date  . Tonsillectomy    . Breast surgery      lt breast bx x3-negative  . Eye surgery      both cataracts  . Colonoscopy    . Breast lumpectomy with needle localization and axillary sentinel lymph node bx Left 05/09/2014    Procedure: BRACKETED WIRE LOCALIZED LEFT BREAST LUMPECTOMY WITH LEFT  AXILLARY SENTINEL LYMPH NODE BX;  Surgeon: Excell Seltzer, MD;  Location: [facility];  Service: General;  Laterality: Left;     ALLERGIES:  Allergies  Allergen Reactions  . Decadron [Dexamethasone] Anaphylaxis, Swelling and Rash    Swelling of lips and tongue  . Other Anaphylaxis    Patient had antibiotic shot in a MD office , does not know what name of drug is.     CURRENT MEDICATIONS:  Current Outpatient Prescriptions on File Prior to Visit  Medication Sig Dispense Refill  . amLODipine (NORVASC) 5 MG tablet Take 5 mg by mouth at bedtime.     . cholecalciferol (VITAMIN D) 1000 UNITS tablet Take 1 tablet (1,000 Units total) by mouth daily. 100 tablet 12  . rosuvastatin (CRESTOR) 10 MG tablet Take 10 mg by mouth every evening. Pt takes Brand name of this medication    . verapamil (VERELAN PM) 240 MG 24 hr capsule Take 240 mg by mouth every morning.      No current facility-administered medications on file prior to visit.     ONCOLOGIC FAMILY HISTORY:  Family History  Problem Relation Age of Onset  . Brain cancer Sister   . Breast cancer Daughter   . Breast cancer Daughter     youngest daughter possibly had genetic testing     GENETIC COUNSELING/TESTING: No    SOCIAL HISTORY:  BENNIE SCAFF is separated and lives alone in [place], [place].  She has 3 children including a daughter whose husband recently passed away following a heart attack in [place]. Ms. Lennartz is currently retired from [employer]. She denies any  current or history of tobacco, alcohol, or illicit drug use.     PHYSICAL EXAMINATION:  Vital Signs: Filed Vitals:   01/31/15 1021  BP: 156/64  Pulse: 65  Temp: 98.1 F (36.7 C)  Resp: 17   ECOG Performance Status: 0 General: Well-nourished, well-appearing female in no acute distress.  She is unaccompanied in today.   HEENT: Head is atraumatic and normocephalic.  Pupils equal and reactive to light and accomodation. Conjunctivae clear without exudate.  Sclerae anicteric. Oral mucosa is pink, moist, and intact without lesions.  Oropharynx is pink without lesions or erythema.  Lymph: No cervical, supraclavicular, infraclavicular, or axillary lymphadenopathy noted on palpation.  Cardiovascular: Regular rate and rhythm without murmurs, rubs, or gallops. Respiratory: Clear to auscultation bilaterally. Chest expansion symmetric without accessory muscle use on inspiration or expiration.  GI: Abdomen soft and round. No tenderness to palpation. Bowel sounds normoactive in 4 quadrants   GU: Deferred. .   Neuro: No focal deficits. Steady gait.  Psych: Mood and affect normal and appropriate for situation.  Extremities: No edema, cyanosis, or clubbing.  Skin: Warm and dry. No open lesions noted.   LABORATORY DATA:  None for this visit.  DIAGNOSTIC IMAGING:  None for this visit.     ASSESSMENT AND PLAN:   1. Breast cancer: Stage IA (T1cN0) invasive lobular carcinoma of the left breast, grade 2, ER positive, PR positive, HER2/neu negative, S/P lumpectomy/SLNB, pt refused radiation therapy, S/P trial of adjuvant endocrine therapy with both anastrozole and later tamoxifen, both of which were poorly tolerated due to vertigo, weight loss, and malaise thus discontinued, now followed in a program of surveillance.  Ms. Gutt is doing well without clinical symptoms worrisome for disease recurrence. She will follow-up with her medical oncologist,  Dr. Jana Hakim / Gentry Fitz NP, in April 2017 with  history and physical examination per surveillance protocol. She will undergo mammography also in April 2017.  Based on the toxicities she experienced, she will not undergo further trials of endocrine therapy and we will continue to monitor her at this time. A comprehensive survivorship care plan and treatment summary was reviewed with the patient today detailing her breast cancer diagnosis, treatment course, potential late/long-term effects of treatment, appropriate follow-up care with recommendations for the future, and patient education resources.  A copy of this summary, along with a letter will be sent to the patient's primary care provider via in basket message after today's visit.  Ms. Giancola is welcome to return to the Survivorship Clinic in the future, as needed; no follow-up will be scheduled at this time.    2. Cancer screening:  Due to Ms. Weigelt's history and her age, she should receive screening for skin cancers, colon cancer, and gynecologic cancers.  The information and recommendations are listed on the patient's comprehensive care plan/treatment summary and were reviewed in detail with the patient.    3. Health maintenance and wellness promotion: Ms. Westermeyer was encouraged to consume 5-7 servings of fruits and vegetables per day. We reviewed the "Nutrition Rainbow" handout, as well as discussed recommendations to maximize nutrition and minimize recurrence, such as increased intake of fruits, vegetables, lean proteins, and minimizing the intake of red meats and processed foods.  She was also encouraged to engage in moderate to vigorous exercise for 30 minutes per day most days of the week. We discussed the [employer] fitness program, which is designed for cancer survivors to help them become more physically fit after cancer treatments. She also has a card to participate in [place] through the [facility], although she uses her exercise bike daily while watching tv in her home.  She was instructed  to continue to abstain from tobacco and alcohol use.  A copy of the "Take Control of Your Health" brochure was given to her reinforcing these recommendations.   4. Support services/counseling: It is not uncommon for this period of the patient's cancer care trajectory to be one of many emotions and stressors. We discussed an opportunity for her to participate in the next session of [facility] ("Finding Your New Normal") support group series designed for patients after they have completed treatment.  Ms. Agan was encouraged to take advantage of our many other support services programs, support groups, and/or counseling in coping with her new life as a cancer survivor after completing anti-cancer treatment.  She was offered support today through active listening and expressive supportive counseling. She was given information regarding our available services and encouraged to contact me with any questions or for help enrolling in any of our support group/programs.    A total of 45 minutes of face-to-face time was spent with this patient with greater than  50% of that time in counseling and care-coordination.   Sylvan Cheese, NP  Survivorship Program [facility] [phone]   Note: PRIMARY CARE PROVIDER [place] Pepper, [place] [phone]

## 2015-02-20 ENCOUNTER — Telehealth: Payer: Self-pay | Admitting: Nurse Practitioner

## 2015-02-20 NOTE — Telephone Encounter (Signed)
Received call from patient.  Has had rash over face and chin over last few weeks; wondering if it was okay to see dermatologist for evaluation.  No other symptoms; no recent change in medication.  Had been on an antibiotic prescribed by another physician but states that rash predated that medication. Review of medication shows no new drug.  Advised that it was fine for her to see dermatologist.  Pt also reports an episode of a sensation of "drawing up" along her left breast that occurred once.  She states that it has not happened again and denies any mass or lesion.  She denies any injury to the breast but states that as she lives alone, she has to do most of the lifting and moving of things, so she may have pulled something.  Advised to treat symptomatically for now and report if it recurs.  Offered to move her mammogram to earlier (scheduled for April) but pt agreeable to monitor and report if it recurs.  Pt without other questions at this time.

## 2015-05-05 ENCOUNTER — Telehealth: Payer: Self-pay | Admitting: Nurse Practitioner

## 2015-05-05 NOTE — Telephone Encounter (Signed)
lvm to inform pt of appt date/time change 4/27 to 5/5 due to HB being on PAL °

## 2015-05-11 ENCOUNTER — Other Ambulatory Visit (HOSPITAL_BASED_OUTPATIENT_CLINIC_OR_DEPARTMENT_OTHER): Payer: Medicare Other

## 2015-05-11 ENCOUNTER — Ambulatory Visit
Admission: RE | Admit: 2015-05-11 | Discharge: 2015-05-11 | Disposition: A | Discharge status: Home / Self Care | Payer: Medicare Other | Source: Ambulatory Visit | Attending: Oncology | Admitting: Oncology

## 2015-05-11 DIAGNOSIS — C50412 Malignant neoplasm of upper-outer quadrant of left female breast: Secondary | ICD-10-CM

## 2015-05-11 LAB — COMPREHENSIVE METABOLIC PANEL
ALBUMIN: 3.9 g/dL (ref 3.5–5.0)
ALK PHOS: 120 U/L (ref 40–150)
ALT: 10 U/L (ref 0–55)
AST: 16 U/L (ref 5–34)
Anion Gap: 8 mEq/L (ref 3–11)
BILIRUBIN TOTAL: 0.57 mg/dL (ref 0.20–1.20)
BUN: 16.6 mg/dL (ref 7.0–26.0)
CALCIUM: 10.2 mg/dL (ref 8.4–10.4)
CO2: 24 mEq/L (ref 22–29)
CREATININE: 0.9 mg/dL (ref 0.6–1.1)
Chloride: 111 mEq/L — ABNORMAL HIGH (ref 98–109)
EGFR: 69 mL/min/{1.73_m2} — ABNORMAL LOW (ref >90)
Glucose: 90 mg/dl (ref 70–140)
POTASSIUM: 3.8 meq/L (ref 3.5–5.1)
Sodium: 143 mEq/L (ref 136–145)
TOTAL PROTEIN: 8.1 g/dL (ref 6.4–8.3)

## 2015-05-11 LAB — CBC & DIFF AND RETIC
BASO%: 0.9 % (ref 0.0–2.0)
Basophils Absolute: 0.1 10*3/uL (ref 0.0–0.1)
EOS%: 4.5 % (ref 0.0–7.0)
Eosinophils Absolute: 0.2 10*3/uL (ref 0.0–0.5)
HCT: 37.5 % (ref 34.8–46.6)
HGB: 12.3 g/dL (ref 11.6–15.9)
Immature Retic Fract: 2.4 % (ref 1.60–10.00)
LYMPH%: 40.8 % (ref 14.0–49.7)
MCH: 28.5 pg (ref 25.1–34.0)
MCHC: 32.8 g/dL (ref 31.5–36.0)
MCV: 86.8 fL (ref 79.5–101.0)
MONO#: 0.4 10*3/uL (ref 0.1–0.9)
MONO%: 8.3 % (ref 0.0–14.0)
NEUT%: 45.5 % (ref 38.4–76.8)
NEUTROS ABS: 2.4 10*3/uL (ref 1.5–6.5)
PLATELETS: 323 10*3/uL (ref 145–400)
RBC: 4.32 10*6/uL (ref 3.70–5.45)
RDW: 13.1 % (ref 11.2–14.5)
Retic %: 0.68 % — ABNORMAL LOW (ref 0.70–2.10)
Retic Ct Abs: 29.38 10*3/uL — ABNORMAL LOW (ref 33.70–90.70)
WBC: 5.3 10*3/uL (ref 3.9–10.3)
lymph#: 2.2 10*3/uL (ref 0.9–3.3)

## 2015-05-11 LAB — FERRITIN: FERRITIN: 149 ng/mL (ref 9–269)

## 2015-05-18 ENCOUNTER — Ambulatory Visit: Payer: Medicare Other | Admitting: Nurse Practitioner

## 2015-05-26 ENCOUNTER — Ambulatory Visit: Payer: Medicare Other | Admitting: Nurse Practitioner

## 2015-06-02 ENCOUNTER — Ambulatory Visit (HOSPITAL_BASED_OUTPATIENT_CLINIC_OR_DEPARTMENT_OTHER): Payer: Medicare Other | Admitting: Nurse Practitioner

## 2015-06-02 ENCOUNTER — Telehealth: Payer: Self-pay | Admitting: Nurse Practitioner

## 2015-06-02 ENCOUNTER — Encounter: Payer: Self-pay | Admitting: Nurse Practitioner

## 2015-06-02 VITALS — BP 149/72 | HR 57 | Temp 98.3°F | Resp 18 | Ht 65.0 in | Wt 168.2 lb

## 2015-06-02 DIAGNOSIS — Z170 Estrogen receptor positive status [ER+]: Secondary | ICD-10-CM

## 2015-06-02 DIAGNOSIS — Z803 Family history of malignant neoplasm of breast: Secondary | ICD-10-CM

## 2015-06-02 DIAGNOSIS — C50412 Malignant neoplasm of upper-outer quadrant of left female breast: Secondary | ICD-10-CM

## 2015-06-02 NOTE — Telephone Encounter (Signed)
appt made and avs printed °

## 2015-06-02 NOTE — Progress Notes (Signed)
[place]  Telephone:(336) [phone] Fax:(336) [phone]     ID: Kathryn Haley DOB: [birth year]  MR#: [id number]  AYT#:[id number]  Patient Care Team: London Pepper, MD as PCP - General (Family Medicine) Excell Seltzer, MD as Consulting Physician (General Surgery) Chauncey Cruel, MD as Consulting Physician (Oncology) Thea Silversmith, MD as Consulting Physician (Radiation Oncology) Mauro Kaufmann, RN as Registered Nurse Rockwell Germany, RN as Registered Nurse Sylvan Cheese, NP as Nurse Practitioner (Hematology and Oncology) PCP: London Pepper, MD OTHER MD: Nicki Reaper McDiarmid M.D., Dr London Pepper  CHIEF COMPLAINT: Estrogen receptor positive lobular breast cancer  CURRENT TREATMENT: Awaiting adjuvant radiation   BREAST CANCER HISTORY: From the original intake note:  The patient had routine breast cancer screening at [facility] 02/24/2014. This showed the breast density to be category B. Some irregularities in the left breast appeared suspicious and the patient was recalled for unilateral left [facility] March 11 2014. This showed scar tissue in the upper outer left breast corresponding to an area of surgical scar. (The patient tells me she had 3 cysts removed remotely from her left breast, but were benign). The patient has a history of prior benign excisional left breast biopsy. There were also 3 groups of calcifications in a linear fashion spanning an area of 4.9 cm.  Biopsy of the left breast upper outer quadrant 04/04/2014 showed ([id number]) invasive lobular carcinoma (E-cadherin negative) grade 2, estrogen receptor 99% positive, progesterone receptor 44% positive, both with strong staining intensity, with an MIB-1 of 16%, and no HER-2 amplification, the signals ratio being 1.06 and the number per cell 1.80.  On 04/13/2014 the patient underwent bilateral breast MRI showing in the left breast at the 3:00 location an area of  irregular clumped nodular enhancement measuring 4.5 cm. There was clip artifact at the anterior aspect of this. There were no other areas of concern in the left breast, and no abnormal appearing lymph nodes. The right breast was unremarkable.  Her subsequent history is as detailed below  INTERVAL HISTORY: Kathryn Haley") returns today for follow-up of her breast cancer. She has been off of antiestrogen therapy for months, and she feels like all of her problems have been resolved. The interval history is generally unremarkable.  REVIEW OF SYSTEMS: Kathryn Haley denies fevers, chills, nausea, or vomiting. Her appetite is still not great. She has episodes of vertigo. She has a history of arthritis, but this not any worse. There is tingling in her right wrist that has been there for decades. A detailed review of systems is otherwise entirely negative.   PAST MEDICAL HISTORY: Past Medical History  Diagnosis Date  . Hypercholesteremia   . Hypertension   . Breast cancer of upper-outer quadrant of left female breast ([place]) 04/11/2014  . Breast cancer ([place])   . Arthritis   . Anxiety   . Urinary frequency   . Heart murmur     echo 2013-mild regurg-aortic,mitral,tricu  . Family history of breast cancer     PAST SURGICAL HISTORY: Past Surgical History  Procedure Laterality Date  . Tonsillectomy    . Breast surgery      lt breast bx x3-negative  . Eye surgery      both cataracts  . Colonoscopy    . Breast lumpectomy with needle localization and axillary sentinel lymph node bx Left 05/09/2014    Procedure: BRACKETED WIRE LOCALIZED LEFT BREAST LUMPECTOMY WITH LEFT  AXILLARY SENTINEL LYMPH NODE BX;  Surgeon:  Excell Seltzer, MD;  Location: [facility];  Service: General;  Laterality: Left;    FAMILY HISTORY Family History  Problem Relation Age of Onset  . Brain cancer Sister   . Breast cancer Daughter   . Breast cancer Daughter     youngest daughter possibly had genetic testing    patient's father died from a heart attack at age 73. The patient's mother died from heart failure at age 15. The patient had one brother, 2 sisters. One of her sisters died from melanoma metastatic to the brain in 2007 area the patient has 3 daughters, 2 of them were diagnosed with breast cancer around the age of 4. They both live in [place] and both are doing well. She does not know whether they were genetically tested. There is no other history of breast or ovarian cancer in the family to the patient's knowledge.  GYNECOLOGIC HISTORY:  No LMP recorded. Patient is postmenopausal. Menarche age 29, first live birth age 47, the patient is [place] P3. She stopped having periods around age 59. She did not take hormone replacement.  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 She is separated and lives alone, with no pets. She has no family in this area other than a nephew, Kathryn Haley, who lives 3 hours away the patient's children are well in the [place] who lives in [place] and is disabled; Kathryn Haley who lives in [place] and works for [employer] and [employer]; and Kathryn Haley, who also lives in [place] and is a homemaker. Kathryn Haley are the 2 daughters who had breast cancer in their early 36s the patient attends a local [place]: In place, and being updated through a lawyer per the patient. In case of an accident she requests we call Kathryn Haley , close friend, at [phone]   HEALTH MAINTENANCE: Social History  Substance Use Topics  . Smoking status: Never Smoker   . Smokeless tobacco: Not on file  . Alcohol Use: No     Colonoscopy: In [place], 2008  PAP: 2014  Bone density: Never  Lipid panel:  Allergies  Allergen Reactions  . Decadron [Dexamethasone] Anaphylaxis, Swelling and Rash    Swelling of lips and tongue  . Other Anaphylaxis    Patient had antibiotic shot in a MD office , does  not know what name of drug is.    Current Outpatient Prescriptions  Medication Sig Dispense Refill  . amLODipine (NORVASC) 5 MG tablet Take 5 mg by mouth at bedtime.     . cholecalciferol (VITAMIN D) 1000 UNITS tablet Take 1 tablet (1,000 Units total) by mouth daily. 100 tablet 12  . cyanocobalamin 100 MCG tablet Take 100 mcg by mouth daily.    . ferrous sulfate 325 (65 FE) MG tablet Take 325 mg by mouth daily with breakfast.    . rosuvastatin (CRESTOR) 10 MG tablet Take 10 mg by mouth every evening. Pt takes Brand name of this medication    . verapamil (VERELAN PM) 240 MG 24 hr capsule Take 240 mg by mouth every morning.      No current facility-administered medications for this visit.    OBJECTIVE: Middle-aged African-American woman who appears stated age 63 Vitals:   06/02/15 1141  BP: 149/72  Pulse: 57  Temp: 98.3 F (36.8 C)  Resp: 18     Body mass index is 27.99 kg/(m^2).  ECOG FS:1 - Symptomatic but completely ambulatory  Skin: warm, dry  HEENT: sclerae anicteric, conjunctivae pink, oropharynx clear. No thrush or mucositis.  Lymph Nodes: No cervical or supraclavicular lymphadenopathy  Lungs: clear to auscultation bilaterally, no rales, wheezes, or rhonci  Heart: regular rate and rhythm, systolic murmur Abdomen: round, soft, non tender, positive bowel sounds  Musculoskeletal: No focal spinal tenderness, no peripheral edema  Neuro: non focal, well oriented, positive affect  Breasts: left breast status post lumpectomy and radiation. Chronically inverted nipple. No evidence of recurrent disease. Left axilla benign. Right breast unremarkable.  LAB RESULTS:  CMP     Component Value Date/Time   NA 143 05/11/2015 1211   NA 142 05/28/2011 0508   K 3.8 05/11/2015 1211   K 4.0 05/28/2011 0508   CL 107 05/28/2011 0508   CO2 24 05/11/2015 1211   CO2 26 05/28/2011 0508   GLUCOSE 90 05/11/2015 1211   GLUCOSE 99 05/28/2011 0508   BUN 16.6 05/11/2015 1211   BUN 19  05/28/2011 0508   CREATININE 0.9 05/11/2015 1211   CREATININE 0.95 05/28/2011 0508   CALCIUM 10.2 05/11/2015 1211   CALCIUM 9.7 05/28/2011 0508   PROT 8.1 05/11/2015 1211   PROT 6.9 05/28/2011 0508   ALBUMIN 3.9 05/11/2015 1211   ALBUMIN 3.5 05/28/2011 0508   AST 16 05/11/2015 1211   AST 14 05/28/2011 0508   ALT 10 05/11/2015 1211   ALT 14 05/28/2011 0508   ALKPHOS 120 05/11/2015 1211   ALKPHOS 104 05/28/2011 0508   BILITOT 0.57 05/11/2015 1211   BILITOT 0.3 05/28/2011 0508   GFRNONAA 59* 05/28/2011 0508   GFRAA 69* 05/28/2011 0508    INo results found for: SPEP, UPEP  Lab Results  Component Value Date   WBC 5.3 05/11/2015   NEUTROABS 2.4 05/11/2015   HGB 12.3 05/11/2015   HCT 37.5 05/11/2015   MCV 86.8 05/11/2015   PLT 323 05/11/2015      Chemistry      Component Value Date/Time   NA 143 05/11/2015 1211   NA 142 05/28/2011 0508   K 3.8 05/11/2015 1211   K 4.0 05/28/2011 0508   CL 107 05/28/2011 0508   CO2 24 05/11/2015 1211   CO2 26 05/28/2011 0508   BUN 16.6 05/11/2015 1211   BUN 19 05/28/2011 0508   CREATININE 0.9 05/11/2015 1211   CREATININE 0.95 05/28/2011 0508      Component Value Date/Time   CALCIUM 10.2 05/11/2015 1211   CALCIUM 9.7 05/28/2011 0508   ALKPHOS 120 05/11/2015 1211   ALKPHOS 104 05/28/2011 0508   AST 16 05/11/2015 1211   AST 14 05/28/2011 0508   ALT 10 05/11/2015 1211   ALT 14 05/28/2011 0508   BILITOT 0.57 05/11/2015 1211   BILITOT 0.3 05/28/2011 0508       No results found for: LABCA2  No components found for: [id number]  No results for input(s): INR in the last 168 hours.  Urinalysis    Component Value Date/Time   COLORURINE YELLOW 05/27/2011 1706   APPEARANCEUR CLEAR 05/27/2011 1706   LABSPEC 1.019 05/27/2011 1706   PHURINE 6.0 05/27/2011 1706   GLUCOSEU NEGATIVE 05/27/2011 1706   HGBUR NEGATIVE 05/27/2011 1706   BILIRUBINUR NEGATIVE 05/27/2011 1706   KETONESUR NEGATIVE 05/27/2011 1706   PROTEINUR 100* 05/27/2011  1706   UROBILINOGEN 0.2 05/27/2011 1706   NITRITE NEGATIVE 05/27/2011 1706   LEUKOCYTESUR SMALL* 05/27/2011 1706    STUDIES: Mm Diag Breast Tomo Bilateral  05/11/2015  CLINICAL DATA:  75 year old female presenting for routine postlumpectomy follow-up status post left breast lumpectomy in 2016. EXAM: 2D DIGITAL DIAGNOSTIC BILATERAL MAMMOGRAM WITH CAD AND ADJUNCT TOMO COMPARISON:  Previous exam(s). ACR Breast Density Category b: There are scattered areas of fibroglandular density. FINDINGS: Interval postlumpectomy changes in the upper-outer left breast without evidence of disease recurrence. No suspicious calcifications, masses or areas of distortion are seen in the bilateral breasts. Mammographic images were processed with CAD. IMPRESSION: Interval postlumpectomy changes in the left breast. No mammographic evidence of malignancy in the bilateral breasts. RECOMMENDATION: Diagnostic mammogram is suggested in 1 year. (Code:DM-B-01Y) I have discussed the findings and recommendations with the patient. Results were also provided in writing at the conclusion of the visit. If applicable, a reminder letter will be sent to the patient regarding the next appointment. BI-RADS CATEGORY  2: Benign. Electronically Signed   By: Ammie Ferrier M.D.   On: 05/11/2015 11:32    ASSESSMENT: 75 y.o. [place] woman status post left breast biopsy 04/04/2014 for a clinical T2 N0, stage IIA invasive lobular carcinoma, estrogen receptor 99% positive, progesterone receptor 44% positive, with an MIB-1 of 16% and no HER-2 amplification.  (1) status post left lumpectomy and sentinel lymph node sampling 05/09/2014 for a pT1c pN0, stage IA invasive lobular carcinoma, grade 2, with repeat HER-2 again negative. Margins were clear  (2) Oncotype DX score of 8 predicts a risk of recurrence outside the breast of 6% within 10 years if the patient's only systemic therapy is tamoxifen for 5 years. It also predicts no benefit from  chemotherapy.  (3) the patient opted against adjuvant radiation  (4) anastrozole started 06/28/2014, stopped 07/15/2014.   (5) tamoxifen begun August 2016, stopped after 2 weeks with significant side effects  (6) updated genetics testing of the patient's daughters suggested  PLAN: Kathryn Haley looks and feels well today. Her most recent mammogram was benign. The labs were reviewed in detail and were stable. There is no evidence of recurrent disease. We will continue to follow her with observation alone, as she prefers to avoid the use of antiestrogens.   Kathryn Haley will return in 6 months for follow up. She understands and agree with this plan. She has been encouraged to call with any issues that might arise before her next visit here.    Laurie Panda, NP   06/02/2015 12:22 PM

## 2015-10-23 ENCOUNTER — Telehealth: Payer: Self-pay | Admitting: *Deleted

## 2015-12-04 ENCOUNTER — Other Ambulatory Visit: Payer: Self-pay | Admitting: *Deleted

## 2015-12-04 DIAGNOSIS — C50412 Malignant neoplasm of upper-outer quadrant of left female breast: Secondary | ICD-10-CM

## 2015-12-05 ENCOUNTER — Ambulatory Visit (HOSPITAL_BASED_OUTPATIENT_CLINIC_OR_DEPARTMENT_OTHER): Payer: Medicare Other | Admitting: Oncology

## 2015-12-05 ENCOUNTER — Other Ambulatory Visit (HOSPITAL_BASED_OUTPATIENT_CLINIC_OR_DEPARTMENT_OTHER): Payer: Medicare Other

## 2015-12-05 VITALS — BP 159/72 | HR 57 | Temp 98.1°F | Resp 18 | Ht 65.0 in | Wt 160.1 lb

## 2015-12-05 DIAGNOSIS — Z170 Estrogen receptor positive status [ER+]: Secondary | ICD-10-CM | POA: Diagnosis not present

## 2015-12-05 DIAGNOSIS — C50412 Malignant neoplasm of upper-outer quadrant of left female breast: Secondary | ICD-10-CM | POA: Diagnosis not present

## 2015-12-05 LAB — COMPREHENSIVE METABOLIC PANEL
ALT: 13 U/L (ref 0–55)
AST: 17 U/L (ref 5–34)
Albumin: 3.6 g/dL (ref 3.5–5.0)
Alkaline Phosphatase: 122 U/L (ref 40–150)
Anion Gap: 9 mEq/L (ref 3–11)
BUN: 20.7 mg/dL (ref 7.0–26.0)
CO2: 24 meq/L (ref 22–29)
Calcium: 10 mg/dL (ref 8.4–10.4)
Chloride: 108 mEq/L (ref 98–109)
Creatinine: 0.9 mg/dL (ref 0.6–1.1)
EGFR: 74 mL/min/{1.73_m2} — AB (ref >90)
GLUCOSE: 101 mg/dL (ref 70–140)
Potassium: 3.6 mEq/L (ref 3.5–5.1)
Sodium: 142 mEq/L (ref 136–145)
Total Bilirubin: 0.39 mg/dL (ref 0.20–1.20)
Total Protein: 7.7 g/dL (ref 6.4–8.3)

## 2015-12-05 LAB — CBC WITH DIFFERENTIAL/PLATELET
BASO%: 1 % (ref 0.0–2.0)
BASOS ABS: 0.1 10*3/uL (ref 0.0–0.1)
EOS ABS: 0.3 10*3/uL (ref 0.0–0.5)
EOS%: 4.3 % (ref 0.0–7.0)
HCT: 35.8 % (ref 34.8–46.6)
HGB: 11.6 g/dL (ref 11.6–15.9)
LYMPH%: 40.9 % (ref 14.0–49.7)
MCH: 28.1 pg (ref 25.1–34.0)
MCHC: 32.4 g/dL (ref 31.5–36.0)
MCV: 86.8 fL (ref 79.5–101.0)
MONO#: 0.4 10*3/uL (ref 0.1–0.9)
MONO%: 6.9 % (ref 0.0–14.0)
NEUT#: 2.8 10*3/uL (ref 1.5–6.5)
NEUT%: 46.9 % (ref 38.4–76.8)
Platelets: 308 10*3/uL (ref 145–400)
RBC: 4.12 10*6/uL (ref 3.70–5.45)
RDW: 13.9 % (ref 11.2–14.5)
WBC: 5.9 10*3/uL (ref 3.9–10.3)
lymph#: 2.4 10*3/uL (ref 0.9–3.3)

## 2015-12-05 NOTE — Progress Notes (Addendum)
[place]  Telephone:(336) [phone] Fax:(336) [phone]     ID: Kathryn Haley DOB: May 29, 1940  MR#: [id number]  WOE#:[id number]  Patient Care Team: Kathryn Pepper, MD as PCP - General (Family Medicine) Kathryn Seltzer, MD as Consulting Physician (General Surgery) Kathryn Cruel, MD as Consulting Physician (Oncology) Kathryn Silversmith, MD as Consulting Physician (Radiation Oncology) Kathryn Kaufmann, RN as Registered Nurse Kathryn Germany, RN as Registered Nurse Kathryn Cheese, NP as Nurse Practitioner (Hematology and Oncology) PCP: Kathryn Pepper, MD OTHER MD: Kathryn Haley M.D., Dr Kathryn Haley  CHIEF COMPLAINT: Estrogen receptor positive lobular breast cancer  CURRENT TREATMENT: Observation   BREAST CANCER HISTORY: From the original intake note:  The patient had routine breast cancer screening at [facility] 02/24/2014. This showed the breast density to be category B. Some irregularities in the left breast appeared suspicious and the patient was recalled for unilateral left diagnostic mammography at [facility] March 11 2014. This showed scar tissue in the upper outer left breast corresponding to an area of surgical scar. (The patient tells me she had 3 cysts removed remotely from her left breast, but were benign). The patient has a history of prior benign excisional left breast biopsy. There were also 3 groups of calcifications in a linear fashion spanning an area of 4.9 cm.  Biopsy of the left breast upper outer quadrant 04/04/2014 showed ([id number]) invasive lobular carcinoma (E-cadherin negative) grade 2, estrogen receptor 99% positive, progesterone receptor 44% positive, both with strong staining intensity, with an MIB-1 of 16%, and no HER-2 amplification, the signals ratio being 1.06 and the number per cell 1.80.  On 04/13/2014 the patient underwent bilateral breast MRI showing in the left breast at the 3:00 location an area of irregular  clumped nodular enhancement measuring 4.5 cm. There was clip artifact at the anterior aspect of this. There were no other areas of concern in the left breast, and no abnormal appearing lymph nodes. The right breast was unremarkable.  Her subsequent history is as detailed below  INTERVAL HISTORY: Kathryn Haley returns today for follow-up of her estrogen receptor positive breast cancer. She is on observation alone, having had very poor tolerance of anti-estrogens and very little motivation to continue that.  REVIEW OF SYSTEMS: Kathryn Haley exercises daily at home. She also goes to [employer] gets a cart and walks up and down the entire store. Asked what her worst problem is she does not have one. A detailed review of systems today was overall benign  PAST MEDICAL HISTORY: Past Medical History:  Diagnosis Date  . Anxiety   . Arthritis   . Breast cancer ([place])   . Breast cancer of upper-outer quadrant of left female breast ([place]) 04/11/2014  . Family history of breast cancer   . Heart murmur    echo 2013-mild regurg-aortic,mitral,tricu  . Hypercholesteremia   . Hypertension   . Urinary frequency     PAST SURGICAL HISTORY: Past Surgical History:  Procedure Laterality Date  . BREAST LUMPECTOMY WITH NEEDLE LOCALIZATION AND AXILLARY SENTINEL LYMPH NODE BX Left 05/09/2014   Procedure: BRACKETED WIRE LOCALIZED LEFT BREAST LUMPECTOMY WITH LEFT  AXILLARY SENTINEL LYMPH NODE BX;  Surgeon: Kathryn Seltzer, MD;  Location: [facility];  Service: General;  Laterality: Left;  . BREAST SURGERY     lt breast bx x3-negative  . COLONOSCOPY    . EYE SURGERY     both cataracts  . TONSILLECTOMY      FAMILY HISTORY Family History  Problem Relation Age of Onset  . Brain cancer Sister   . Breast cancer Daughter   . Breast cancer Daughter     youngest daughter possibly had genetic testing   patient's father died from a heart attack at age 10. The patient's mother died from heart failure at age 15. The  patient had one brother, 2 sisters. One of her sisters died from melanoma metastatic to the brain in 2007 area the patient has 3 daughters, 2 of them were diagnosed with breast cancer around the age of 75. They both live in [place] and both are doing well. She does not know whether they were genetically tested. There is no other history of breast or ovarian cancer in the family to the patient's knowledge.  GYNECOLOGIC HISTORY:  No LMP recorded. Patient is postmenopausal. Menarche age 26, first live birth age 20, [name]. She stopped having periods around age 41. She did not take hormone replacement.  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 She is separated and lives alone, with no pets. She has no family in this area other than a nephew, Kathryn Haley, who lives 3 hours away the patient's children are well in the [place] who lives in [place] and is disabled; Kathryn Haley who lives in [place] and works for [employer] and [employer]; and Kathryn Haley, who also lives in [place] and is a homemaker. Kathryn Haley and Kathryn Haley are the 2 daughters who had breast cancer in their early 37s the patient attends a local [place]: In place, and being updated through a lawyer per the patient. In case of an accident she requests we call Kathryn Haley , close friend, at [phone]   HEALTH MAINTENANCE: Social History  Substance Use Topics  . Smoking status: Never Smoker  . Smokeless tobacco: Not on file  . Alcohol use No     Colonoscopy: In [place], 2008  PAP: 2014  Bone density: Never  Lipid panel:  Allergies  Allergen Reactions  . Decadron [Dexamethasone] Anaphylaxis, Swelling and Rash    Swelling of lips and tongue  . Other Anaphylaxis    Patient had antibiotic shot in a MD office , does not know what name of drug is.    Current Outpatient Prescriptions  Medication Sig Dispense Refill  . amLODipine  (NORVASC) 5 MG tablet Take 5 mg by mouth at bedtime.     . cholecalciferol (VITAMIN D) 1000 UNITS tablet Take 1 tablet (1,000 Units total) by mouth daily. 100 tablet 12  . cyanocobalamin 100 MCG tablet Take 100 mcg by mouth daily.    . ferrous sulfate 325 (65 FE) MG tablet Take 325 mg by mouth daily with breakfast.    . rosuvastatin (CRESTOR) 10 MG tablet Take 10 mg by mouth every evening. Pt takes Brand name of this medication    . verapamil (VERELAN PM) 240 MG 24 hr capsule Take 240 mg by mouth every morning.      No current facility-administered medications for this visit.     OBJECTIVE: Middle-aged African-American woman In no acute distress Vitals:   12/05/15 1146  BP: (!) 159/72  Pulse: (!) 57  Resp: 18  Temp: 98.1 F (36.7 C)     Body mass index is 26.64 kg/m.    ECOG FS:0 - Asymptomatic  Sclerae unicteric, EOMs intact Oropharynx clear and moist No cervical or supraclavicular adenopathy Lungs  no rales or rhonchi Heart regular rate and rhythm Abd soft, nontender, positive bowel sounds MSK no focal spinal tenderness, no upper extremity lymphedema Neuro: nonfocal, well oriented, appropriate affect Breasts: The right breast is unremarkable. The left breast is status post lumpectomy and radiation. There is minimal nipple inversion as previously noted. The left axilla is benign   LAB RESULTS:  CMP     Component Value Date/Time   NA 142 12/05/2015 1109   K 3.6 12/05/2015 1109   CL 107 05/28/2011 0508   CO2 24 12/05/2015 1109   GLUCOSE 101 12/05/2015 1109   BUN 20.7 12/05/2015 1109   CREATININE 0.9 12/05/2015 1109   CALCIUM 10.0 12/05/2015 1109   PROT 7.7 12/05/2015 1109   ALBUMIN 3.6 12/05/2015 1109   AST 17 12/05/2015 1109   ALT 13 12/05/2015 1109   ALKPHOS 122 12/05/2015 1109   BILITOT 0.39 12/05/2015 1109   GFRNONAA 59 (L) 05/28/2011 0508   GFRAA 69 (L) 05/28/2011 0508    INo results found for: SPEP, UPEP  Lab Results  Component Value Date   WBC 5.9  12/05/2015   NEUTROABS 2.8 12/05/2015   HGB 11.6 12/05/2015   HCT 35.8 12/05/2015   MCV 86.8 12/05/2015   PLT 308 12/05/2015      Chemistry      Component Value Date/Time   NA 142 12/05/2015 1109   K 3.6 12/05/2015 1109   CL 107 05/28/2011 0508   CO2 24 12/05/2015 1109   BUN 20.7 12/05/2015 1109   CREATININE 0.9 12/05/2015 1109      Component Value Date/Time   CALCIUM 10.0 12/05/2015 1109   ALKPHOS 122 12/05/2015 1109   AST 17 12/05/2015 1109   ALT 13 12/05/2015 1109   BILITOT 0.39 12/05/2015 1109       No results found for: LABCA2  No components found for: [id number]  No results for input(s): INR in the last 168 hours.  Urinalysis    Component Value Date/Time   COLORURINE YELLOW 05/27/2011 1706   APPEARANCEUR CLEAR 05/27/2011 1706   LABSPEC 1.019 05/27/2011 1706   PHURINE 6.0 05/27/2011 1706   GLUCOSEU NEGATIVE 05/27/2011 1706   HGBUR NEGATIVE 05/27/2011 1706   BILIRUBINUR NEGATIVE 05/27/2011 1706   KETONESUR NEGATIVE 05/27/2011 1706   PROTEINUR 100 (A) 05/27/2011 1706   UROBILINOGEN 0.2 05/27/2011 1706   NITRITE NEGATIVE 05/27/2011 1706   LEUKOCYTESUR SMALL (A) 05/27/2011 1706    STUDIES: No results found.  ASSESSMENT: 75 y.o. [place] woman status post left breast Upper outer quadrant biopsy 04/04/2014 for a clinical T2 N0, stage IIA invasive lobular carcinoma, estrogen receptor 99% positive, progesterone receptor 44% positive, with an MIB-1 of 16% and no HER-2 amplification.  (1) status post left lumpectomy and sentinel lymph node sampling 05/09/2014 for a pT1c pN0, stage IA invasive lobular carcinoma, grade 2, with repeat HER-2 again negative. Margins were clear  (2) Oncotype DX score of 8 predicts a risk of recurrence outside the breast of 6% within 10 years if the patient's only systemic therapy is tamoxifen for 5 years. It also predicts no benefit from chemotherapy.  (3) the patient opted against adjuvant radiation  (4) anastrozole started  06/28/2014, stopped 07/15/2014.   (5) tamoxifen begun August 2016, stopped after 2 weeks with significant side effects  (6) updated genetics testing of the patient's daughters suggested  PLAN: Shaynah Is now a year and a half out from definitive surgery for her breast cancer with no evidence of disease recurrence. This is very  favorable.  She will be due for repeat mammography in April. Accordingly I will see her again in May and then yearly thereafter until she completes 5 years of follow-up.  I encouraged her to continue her excellent exercise program. She will call me with any problems that may develop before her next visit here.   Kathryn Cruel, MD   12/05/2015 11:56 AM

## 2016-01-05 NOTE — Telephone Encounter (Signed)
No entry 

## 2016-02-12 DIAGNOSIS — Z0000 Encounter for general adult medical examination without abnormal findings: Secondary | ICD-10-CM | POA: Diagnosis not present

## 2016-02-12 DIAGNOSIS — Z6827 Body mass index (BMI) 27.0-27.9, adult: Secondary | ICD-10-CM | POA: Diagnosis not present

## 2016-02-12 DIAGNOSIS — E785 Hyperlipidemia, unspecified: Secondary | ICD-10-CM | POA: Diagnosis not present

## 2016-02-12 DIAGNOSIS — I10 Essential (primary) hypertension: Secondary | ICD-10-CM | POA: Diagnosis not present

## 2016-02-12 DIAGNOSIS — Z79899 Other long term (current) drug therapy: Secondary | ICD-10-CM | POA: Diagnosis not present

## 2016-02-19 DIAGNOSIS — E785 Hyperlipidemia, unspecified: Secondary | ICD-10-CM | POA: Diagnosis not present

## 2016-02-19 DIAGNOSIS — R109 Unspecified abdominal pain: Secondary | ICD-10-CM | POA: Diagnosis not present

## 2016-02-19 DIAGNOSIS — N3281 Overactive bladder: Secondary | ICD-10-CM | POA: Diagnosis not present

## 2016-02-19 DIAGNOSIS — I10 Essential (primary) hypertension: Secondary | ICD-10-CM | POA: Diagnosis not present

## 2016-02-19 DIAGNOSIS — D649 Anemia, unspecified: Secondary | ICD-10-CM | POA: Diagnosis not present

## 2016-02-19 DIAGNOSIS — L309 Dermatitis, unspecified: Secondary | ICD-10-CM | POA: Diagnosis not present

## 2016-03-15 ENCOUNTER — Telehealth: Payer: Self-pay | Admitting: *Deleted

## 2016-03-15 NOTE — Telephone Encounter (Signed)
This RN spoke with pt per her call with concerns due to " not feeling well and losing weight and am concerned it is the cancer "  This RN reviewed pt's history including low percentage of recurrence.  Pt was seen last month by primary MD " but he didn't know and really he doesn't do much but check my bp and heart"  Per phone discussion Zephyra did state " stress from losing a dear friend ".  She states decreased appetite, weight loss, disturbances in sleep and fatigue.  Plan per call is pt will be scheduled for visit for follow up prior to scheduled appointment in May.  Request sent to scheduling.

## 2016-03-18 ENCOUNTER — Other Ambulatory Visit: Payer: Self-pay | Admitting: Adult Health

## 2016-03-18 DIAGNOSIS — Z170 Estrogen receptor positive status [ER+]: Principal | ICD-10-CM

## 2016-03-18 DIAGNOSIS — C50412 Malignant neoplasm of upper-outer quadrant of left female breast: Secondary | ICD-10-CM

## 2016-03-19 ENCOUNTER — Telehealth: Payer: Self-pay | Admitting: *Deleted

## 2016-03-19 ENCOUNTER — Ambulatory Visit (HOSPITAL_BASED_OUTPATIENT_CLINIC_OR_DEPARTMENT_OTHER): Payer: Medicare HMO | Admitting: Adult Health

## 2016-03-19 ENCOUNTER — Ambulatory Visit (HOSPITAL_BASED_OUTPATIENT_CLINIC_OR_DEPARTMENT_OTHER): Payer: Medicare HMO

## 2016-03-19 ENCOUNTER — Other Ambulatory Visit (HOSPITAL_BASED_OUTPATIENT_CLINIC_OR_DEPARTMENT_OTHER): Payer: Medicare HMO

## 2016-03-19 VITALS — BP 151/62 | HR 63 | Temp 97.7°F | Resp 18 | Ht 65.0 in | Wt 160.6 lb

## 2016-03-19 DIAGNOSIS — R634 Abnormal weight loss: Secondary | ICD-10-CM

## 2016-03-19 DIAGNOSIS — C50412 Malignant neoplasm of upper-outer quadrant of left female breast: Secondary | ICD-10-CM

## 2016-03-19 DIAGNOSIS — R1032 Left lower quadrant pain: Secondary | ICD-10-CM | POA: Diagnosis not present

## 2016-03-19 DIAGNOSIS — Z170 Estrogen receptor positive status [ER+]: Principal | ICD-10-CM

## 2016-03-19 LAB — CBC WITH DIFFERENTIAL/PLATELET
BASO%: 1.2 % (ref 0.0–2.0)
Basophils Absolute: 0.1 10*3/uL (ref 0.0–0.1)
EOS%: 5.4 % (ref 0.0–7.0)
Eosinophils Absolute: 0.3 10*3/uL (ref 0.0–0.5)
HCT: 36.2 % (ref 34.8–46.6)
HGB: 12.1 g/dL (ref 11.6–15.9)
LYMPH%: 37.7 % (ref 14.0–49.7)
MCH: 28.8 pg (ref 25.1–34.0)
MCHC: 33.5 g/dL (ref 31.5–36.0)
MCV: 86.1 fL (ref 79.5–101.0)
MONO#: 0.4 10*3/uL (ref 0.1–0.9)
MONO%: 7.1 % (ref 0.0–14.0)
NEUT%: 48.6 % (ref 38.4–76.8)
NEUTROS ABS: 2.6 10*3/uL (ref 1.5–6.5)
PLATELETS: 273 10*3/uL (ref 145–400)
RBC: 4.2 10*6/uL (ref 3.70–5.45)
RDW: 14.1 % (ref 11.2–14.5)
WBC: 5.3 10*3/uL (ref 3.9–10.3)
lymph#: 2 10*3/uL (ref 0.9–3.3)

## 2016-03-19 LAB — COMPREHENSIVE METABOLIC PANEL
ALT: 13 U/L (ref 0–55)
ANION GAP: 7 meq/L (ref 3–11)
AST: 14 U/L (ref 5–34)
Albumin: 3.9 g/dL (ref 3.5–5.0)
Alkaline Phosphatase: 111 U/L (ref 40–150)
BILIRUBIN TOTAL: 0.4 mg/dL (ref 0.20–1.20)
BUN: 16.6 mg/dL (ref 7.0–26.0)
CHLORIDE: 109 meq/L (ref 98–109)
CO2: 26 meq/L (ref 22–29)
CREATININE: 0.9 mg/dL (ref 0.6–1.1)
Calcium: 9.9 mg/dL (ref 8.4–10.4)
EGFR: 70 mL/min/{1.73_m2} — ABNORMAL LOW (ref >90)
GLUCOSE: 99 mg/dL (ref 70–140)
Potassium: 3.8 mEq/L (ref 3.5–5.1)
SODIUM: 142 meq/L (ref 136–145)
TOTAL PROTEIN: 7.6 g/dL (ref 6.4–8.3)

## 2016-03-19 LAB — TSH: TSH: 1.887 m(IU)/L (ref 0.308–3.960)

## 2016-03-19 NOTE — Telephone Encounter (Signed)
-----   Message from Minette Headland, NP sent at 03/19/2016  1:03 PM EST ----- Please call patient with normal results

## 2016-03-19 NOTE — Telephone Encounter (Signed)
Telephone call to patient advised of results. Pt understands to call this office with any concerns or questions.

## 2016-03-19 NOTE — Progress Notes (Signed)
[facility]  [phone] [phone]     ID: Kathryn Haley DOB: 12-25-1940  MR#: [id number]  KVQ#:[id number]  Patient Care Team: London Pepper, MD as PCP - General (Family Medicine) Excell Seltzer, MD as Consulting Physician (General Surgery) Chauncey Cruel, MD as Consulting Physician (Oncology) Thea Silversmith, MD (Inactive) as Consulting Physician (Radiation Oncology) Mauro Kaufmann, RN as Registered Nurse Rockwell Germany, RN as Registered Nurse Sylvan Cheese, NP as Nurse Practitioner (Hematology and Oncology) PCP: London Pepper, MD OTHER MD: Nicki Reaper McDiarmid M.D., Dr London Pepper  CHIEF COMPLAINT: Estrogen receptor positive lobular breast cancer  CURRENT TREATMENT: Observation   BREAST CANCER HISTORY: From the original intake note:  The patient had routine breast cancer screening at [facility] 02/24/2014. This showed the breast density to be category B. Some irregularities in the left breast appeared suspicious and the patient was recalled for unilateral left diagnostic [facility] March 11 2014. This showed scar tissue in the upper outer left breast corresponding to an area of surgical scar. (The patient tells me she had 3 cysts removed remotely from her left breast, but were benign). The patient has a history of prior benign excisional left breast biopsy. There were also 3 groups of calcifications in a linear fashion spanning an area of 4.9 cm.  Biopsy of the left breast upper outer quadrant 04/04/2014 showed ([id number]) invasive lobular carcinoma (E-cadherin negative) grade 2, estrogen receptor 99% positive, progesterone receptor 44% positive, both with strong staining intensity, with an MIB-1 of 16%, and no HER-2 amplification, the signals ratio being 1.06 and the number per cell 1.80.  On 04/13/2014 the patient underwent bilateral breast MRI showing in the left breast at the 3:00 location an area of  irregular clumped nodular enhancement measuring 4.5 cm. There was clip artifact at the anterior aspect of this. There were no other areas of concern in the left breast, and no abnormal appearing lymph nodes. The right breast was unremarkable.  Her subsequent history is as detailed below  INTERVAL HISTORY: Keyari is here today out of concern over weight loss and decrease muscle mass particularly around her clavicles bilaterally.  She says that it has been progressively getting worse over the past couple of months.  She also had what sounds like a hematoma on her right knee that has resolved and she is concerned about a blood clot.  One of her friends recently passed away and she is very concerned about it. She says she hasn't seen her PCP about this because all they do is blood work, and she wants to make sure she doesn't have cancer anywhere.    REVIEW OF SYSTEMS: Shaneque denies any fevers, chills, night sweats, fatigue, she is having bowel movements daily.  Denies cough, shortness of breath, back pain, chest pain, palpitations, dry skin, decreased appetite, nausea, vomiting, constipation, diarrhea.  She has had some vague left lower quadrant pain, but isn't sure how long it has been going on for.   PAST MEDICAL HISTORY: Past Medical History:  Diagnosis Date  . Anxiety   . Arthritis   . Breast cancer ([place])   . Breast cancer of upper-outer quadrant of left female breast ([place]) 04/11/2014  . Family history of breast cancer   . Heart murmur    echo 2013-mild regurg-aortic,mitral,tricu  . Hypercholesteremia   . Hypertension   . Urinary frequency     PAST SURGICAL HISTORY: Past Surgical History:  Procedure Laterality Date  .  BREAST LUMPECTOMY WITH NEEDLE LOCALIZATION AND AXILLARY SENTINEL LYMPH NODE BX Left 05/09/2014   Procedure: BRACKETED WIRE LOCALIZED LEFT BREAST LUMPECTOMY WITH LEFT  AXILLARY SENTINEL LYMPH NODE BX;  Surgeon: Excell Seltzer, MD;  Location: [facility];  Service:  General;  Laterality: Left;  . BREAST SURGERY     lt breast bx x3-negative  . COLONOSCOPY    . EYE SURGERY     both cataracts  . TONSILLECTOMY      FAMILY HISTORY Family History  Problem Relation Age of Onset  . Brain cancer Sister   . Breast cancer Daughter   . Breast cancer Daughter     youngest daughter possibly had genetic testing   patient's father died from a heart attack at age 25. The patient's mother died from heart failure at age 44. The patient had one brother, 2 sisters. One of her sisters died from melanoma metastatic to the brain in 2007 area the patient has 3 daughters, 2 of them were diagnosed with breast cancer around the age of 25. They both live in [place] and both are doing well. She does not know whether they were genetically tested. There is no other history of breast or ovarian cancer in the family to the patient's knowledge.  GYNECOLOGIC HISTORY:  No LMP recorded. Patient is postmenopausal. Menarche age 13, first live birth age 70, the patient is [place] P3. She stopped having periods around age 76. She did not take hormone replacement.  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 She is separated and lives alone, with no pets. She has no family in this area other than a nephew, Alejandro Mulling, who lives 3 hours away the patient's children are well in the [place] who lives in [place] and is disabled; Iona Beard a [place] who lives in [place] and works for [employer]; and Arnette Schaumann, who also lives in [place] and is a homemaker. [place] and Freda Munro are the 2 daughters who had breast cancer in their early 33s the patient attends a local [place]: In place, and being updated through a lawyer per the patient. In case of an accident she requests we call Elon Spanner , close friend, at [phone]   HEALTH MAINTENANCE: Social History  Substance Use Topics  . Smoking status: Never  Smoker  . Smokeless tobacco: Not on file  . Alcohol use No     Colonoscopy: In [place], 2008  PAP: 2014  Bone density: Never  Lipid panel:  Allergies  Allergen Reactions  . Decadron [Dexamethasone] Anaphylaxis, Swelling and Rash    Swelling of lips and tongue  . Other Anaphylaxis    Patient had antibiotic shot in a MD office , does not know what name of drug is.    Current Outpatient Prescriptions  Medication Sig Dispense Refill  . amLODipine (NORVASC) 5 MG tablet Take 5 mg by mouth at bedtime.     . cholecalciferol (VITAMIN D) 1000 UNITS tablet Take 1 tablet (1,000 Units total) by mouth daily. 100 tablet 12  . cyanocobalamin 100 MCG tablet Take 100 mcg by mouth daily.    . ferrous sulfate 325 (65 FE) MG tablet Take 325 mg by mouth daily with breakfast.    . rosuvastatin (CRESTOR) 10 MG tablet Take 10 mg by mouth every evening. Pt takes Brand name of this medication    . verapamil (VERELAN PM) 240 MG 24  hr capsule Take 240 mg by mouth every morning.      No current facility-administered medications for this visit.     OBJECTIVE: Middle-aged African-American woman In no acute distress Vitals:   03/19/16 0956  BP: (!) 151/62  Pulse: 63  Resp: 18  Temp: 97.7 F (36.5 C)     Body mass index is 26.73 kg/m.    ECOG FS:0 - Asymptomatic  GENERAL: Patient is a well appearing female in no acute distress HEENT:  Sclerae anicteric.  Oropharynx clear and moist. No ulcerations or evidence of oropharyngeal candidiasis. Neck is supple. PERRL NODES:  No cervical, supraclavicular, infraclavicular, or axillary lymphadenopathy palpated.  BREAST EXAM:  Left breast s/p lumpectomy, nipple slightly inverted, no nodularity, masses, or any skin changes, right breast without nodularity, skin changes, nipple changes or masses. LUNGS:  Clear to auscultation bilaterally.  No wheezes or rhonchi. HEART:  Regular rate and rhythm. No murmur appreciated. ABDOMEN:  Soft, nontender.  Positive,  normoactive bowel sounds. No organomegaly palpated. MSK:  No focal spinal tenderness to palpation. Full range of motion bilaterally in the upper extremities. EXTREMITIES:  No peripheral edema.  Warm and well perfused. SKIN:  Clear with no obvious rashes or skin changes. No nail dyscrasia. NEURO:  Nonfocal. Well oriented.  Appropriate affect.     LAB RESULTS:  CMP     Component Value Date/Time   NA 142 03/19/2016 0940   K 3.8 03/19/2016 0940   CL 107 05/28/2011 0508   CO2 26 03/19/2016 0940   GLUCOSE 99 03/19/2016 0940   BUN 16.6 03/19/2016 0940   CREATININE 0.9 03/19/2016 0940   CALCIUM 9.9 03/19/2016 0940   PROT 7.6 03/19/2016 0940   ALBUMIN 3.9 03/19/2016 0940   AST 14 03/19/2016 0940   ALT 13 03/19/2016 0940   ALKPHOS 111 03/19/2016 0940   BILITOT 0.40 03/19/2016 0940   GFRNONAA 59 (L) 05/28/2011 0508   GFRAA 69 (L) 05/28/2011 0508    INo results found for: SPEP, UPEP  Lab Results  Component Value Date   WBC 5.3 03/19/2016   NEUTROABS 2.6 03/19/2016   HGB 12.1 03/19/2016   HCT 36.2 03/19/2016   MCV 86.1 03/19/2016   PLT 273 03/19/2016      Chemistry      Component Value Date/Time   NA 142 03/19/2016 0940   K 3.8 03/19/2016 0940   CL 107 05/28/2011 0508   CO2 26 03/19/2016 0940   BUN 16.6 03/19/2016 0940   CREATININE 0.9 03/19/2016 0940      Component Value Date/Time   CALCIUM 9.9 03/19/2016 0940   ALKPHOS 111 03/19/2016 0940   AST 14 03/19/2016 0940   ALT 13 03/19/2016 0940   BILITOT 0.40 03/19/2016 0940       No results found for: LABCA2  No components found for: LABCA125  No results for input(s): INR in the last 168 hours.  Urinalysis    Component Value Date/Time   COLORURINE YELLOW 05/27/2011 1706   APPEARANCEUR CLEAR 05/27/2011 1706   LABSPEC 1.019 05/27/2011 1706   PHURINE 6.0 05/27/2011 1706   GLUCOSEU NEGATIVE 05/27/2011 1706   HGBUR NEGATIVE 05/27/2011 1706   BILIRUBINUR NEGATIVE 05/27/2011 1706   KETONESUR NEGATIVE 05/27/2011  1706   PROTEINUR 100 (A) 05/27/2011 1706   UROBILINOGEN 0.2 05/27/2011 1706   NITRITE NEGATIVE 05/27/2011 1706   LEUKOCYTESUR SMALL (A) 05/27/2011 1706    STUDIES: No results found.  ASSESSMENT: 76 y.o. [place] woman status post left breast Upper outer quadrant  biopsy 04/04/2014 for a clinical T2 N0, stage IIA invasive lobular carcinoma, estrogen receptor 99% positive, progesterone receptor 44% positive, with an MIB-1 of 16% and no HER-2 amplification.  (1) status post left lumpectomy and sentinel lymph node sampling 05/09/2014 for a pT1c pN0, stage IA invasive lobular carcinoma, grade 2, with repeat HER-2 again negative. Margins were clear  (2) Oncotype DX score of 8 predicts a risk of recurrence outside the breast of 6% within 10 years if the patient's only systemic therapy is tamoxifen for 5 years. It also predicts no benefit from chemotherapy.  (3) the patient opted against adjuvant radiation  (4) anastrozole started 06/28/2014, stopped 07/15/2014.   (5) tamoxifen begun August 2016, stopped after 2 weeks with significant side effects  (6) updated genetics testing of the patient's daughters suggested  PLAN:  Chiyo is doing well today.  I reassured her that her pain and weight loss could be due to several different possibilities.  Her labs are normal today.  I will add on a TSH, and we will do a CT scan.  She has lost about 8 pounds in 5 months unintentionally and has LLQ pain, and she did have a stage II breast cancer.  We will get the CT scan set up and f/u with her once we have the results.    A total of (30) minutes of face-to-face time was spent with this patient with greater than 50% of that time in counseling and care-coordination.    Charlestine Massed, NP   03/19/2016 3:26 PM

## 2016-04-02 ENCOUNTER — Ambulatory Visit (HOSPITAL_COMMUNITY): Payer: Medicare HMO

## 2016-04-09 ENCOUNTER — Encounter (HOSPITAL_COMMUNITY): Payer: Self-pay

## 2016-04-09 ENCOUNTER — Ambulatory Visit (HOSPITAL_COMMUNITY)
Admission: RE | Admit: 2016-04-09 | Discharge: 2016-04-09 | Disposition: A | Discharge status: Home / Self Care | Payer: Medicare HMO | Source: Ambulatory Visit | Attending: Adult Health | Admitting: Adult Health

## 2016-04-09 DIAGNOSIS — R1032 Left lower quadrant pain: Secondary | ICD-10-CM

## 2016-04-09 DIAGNOSIS — C50412 Malignant neoplasm of upper-outer quadrant of left female breast: Secondary | ICD-10-CM | POA: Insufficient documentation

## 2016-04-09 DIAGNOSIS — C50912 Malignant neoplasm of unspecified site of left female breast: Secondary | ICD-10-CM | POA: Diagnosis not present

## 2016-04-09 DIAGNOSIS — I2510 Atherosclerotic heart disease of native coronary artery without angina pectoris: Secondary | ICD-10-CM | POA: Diagnosis not present

## 2016-04-09 DIAGNOSIS — Z170 Estrogen receptor positive status [ER+]: Secondary | ICD-10-CM | POA: Insufficient documentation

## 2016-04-09 DIAGNOSIS — I700 Atherosclerosis of aorta: Secondary | ICD-10-CM | POA: Insufficient documentation

## 2016-04-09 DIAGNOSIS — N289 Disorder of kidney and ureter, unspecified: Secondary | ICD-10-CM | POA: Insufficient documentation

## 2016-04-09 DIAGNOSIS — R634 Abnormal weight loss: Secondary | ICD-10-CM | POA: Diagnosis not present

## 2016-04-09 MED ORDER — IOPAMIDOL (ISOVUE-300) INJECTION 61%
INTRAVENOUS | Status: AC
  Filled 2016-04-09: qty 100

## 2016-04-09 MED ORDER — IOPAMIDOL (ISOVUE-300) INJECTION 61%
100.0000 mL | Freq: Once | INTRAVENOUS | Status: AC | PRN
  Administered 2016-04-09: 100 mL via INTRAVENOUS

## 2016-04-10 ENCOUNTER — Other Ambulatory Visit: Payer: Self-pay | Admitting: Adult Health

## 2016-04-10 ENCOUNTER — Telehealth: Payer: Self-pay | Admitting: Adult Health

## 2016-04-10 DIAGNOSIS — N289 Disorder of kidney and ureter, unspecified: Secondary | ICD-10-CM

## 2016-04-10 NOTE — Telephone Encounter (Signed)
Called patient and discussed CT scan results with her and the need to do MRI with and without contrast.  Patient consented.  I will go ahead and order and will send to [facility] and scheduling.    Gardenia Phlegm, NP

## 2016-04-11 DIAGNOSIS — N289 Disorder of kidney and ureter, unspecified: Secondary | ICD-10-CM | POA: Diagnosis not present

## 2016-04-11 DIAGNOSIS — I2510 Atherosclerotic heart disease of native coronary artery without angina pectoris: Secondary | ICD-10-CM | POA: Diagnosis not present

## 2016-04-12 ENCOUNTER — Telehealth: Payer: Self-pay | Admitting: Adult Health

## 2016-04-12 ENCOUNTER — Encounter: Payer: Self-pay | Admitting: *Deleted

## 2016-04-12 NOTE — Progress Notes (Unsigned)
Patient needs to be scheduled for MRI of abdomen. Per patient needs an open MRI. Notified [facility] for scheduling.  Spoke with Fabby at [place] appointment is scheduled for 04/24/16 1555 pm.

## 2016-04-12 NOTE — Telephone Encounter (Signed)
Per Leamon Arnt at [facility] patient is on the work que for MRI Abdomen W Wo Contrast.,  and they will contact patient soon with appt.

## 2016-04-24 ENCOUNTER — Ambulatory Visit
Admission: RE | Admit: 2016-04-24 | Discharge: 2016-04-24 | Disposition: A | Discharge status: Home / Self Care | Payer: Medicare HMO | Source: Ambulatory Visit | Attending: Adult Health | Admitting: Adult Health

## 2016-04-24 DIAGNOSIS — N289 Disorder of kidney and ureter, unspecified: Secondary | ICD-10-CM

## 2016-04-24 DIAGNOSIS — N2889 Other specified disorders of kidney and ureter: Secondary | ICD-10-CM | POA: Diagnosis not present

## 2016-04-24 MED ORDER — GADOBENATE DIMEGLUMINE 529 MG/ML IV SOLN
15.0000 mL | Freq: Once | INTRAVENOUS | Status: AC | PRN
  Administered 2016-04-24: 15 mL via INTRAVENOUS

## 2016-04-26 ENCOUNTER — Telehealth: Payer: Self-pay | Admitting: Adult Health

## 2016-04-26 DIAGNOSIS — N289 Disorder of kidney and ureter, unspecified: Secondary | ICD-10-CM

## 2016-04-26 NOTE — Telephone Encounter (Signed)
Reviewed mri results with Dr. Alen Blew (per Dr. Jana Hakim recommendation) along with patient.  Per Dr. Alen Blew he recommends repeat MRI in 6 months if grows, then urology referral.  I reviewed the results with the patient and the fact that there is a lesion in the kidney that is small and what the recommendations are in regards to repeating MRI.  She verbalized understanding and knows that we will order MRI abdomen w/wo contrast in 6 months for surveillance of left cystic lesion in kidney.

## 2016-04-29 ENCOUNTER — Telehealth: Payer: Self-pay | Admitting: Adult Health

## 2016-04-29 NOTE — Telephone Encounter (Signed)
Called [facility]  Per sch message 4/6 from [place], per [place] at [place] - order is in and someone from there will contact patient closed to expected date.

## 2016-05-22 DIAGNOSIS — M67911 Unspecified disorder of synovium and tendon, right shoulder: Secondary | ICD-10-CM | POA: Diagnosis not present

## 2016-06-03 ENCOUNTER — Other Ambulatory Visit: Payer: Self-pay

## 2016-06-03 DIAGNOSIS — C50412 Malignant neoplasm of upper-outer quadrant of left female breast: Secondary | ICD-10-CM

## 2016-06-03 DIAGNOSIS — Z170 Estrogen receptor positive status [ER+]: Principal | ICD-10-CM

## 2016-06-04 ENCOUNTER — Other Ambulatory Visit: Payer: Medicare Other

## 2016-06-04 ENCOUNTER — Ambulatory Visit: Payer: Medicare Other | Admitting: Oncology

## 2016-06-10 ENCOUNTER — Telehealth: Payer: Self-pay

## 2016-06-10 DIAGNOSIS — H524 Presbyopia: Secondary | ICD-10-CM | POA: Diagnosis not present

## 2016-06-10 NOTE — Telephone Encounter (Signed)
SENT NOTES TO SCHEDULING 

## 2016-06-12 ENCOUNTER — Telehealth: Payer: Self-pay | Admitting: Cardiology

## 2016-06-12 NOTE — Telephone Encounter (Signed)
Received records from [facility] for appointment on 07/04/16 with Dr Harding.  Records put with Dr Harding's schedule for 07/04/16. lp °

## 2016-06-18 ENCOUNTER — Other Ambulatory Visit: Payer: Self-pay | Admitting: Oncology

## 2016-06-18 DIAGNOSIS — Z9889 Other specified postprocedural states: Secondary | ICD-10-CM

## 2016-06-26 ENCOUNTER — Ambulatory Visit
Admission: RE | Admit: 2016-06-26 | Discharge: 2016-06-26 | Disposition: A | Discharge status: Home / Self Care | Payer: Medicare HMO | Source: Ambulatory Visit | Attending: Oncology | Admitting: Oncology

## 2016-06-26 DIAGNOSIS — R928 Other abnormal and inconclusive findings on diagnostic imaging of breast: Secondary | ICD-10-CM | POA: Diagnosis not present

## 2016-06-26 DIAGNOSIS — Z9889 Other specified postprocedural states: Secondary | ICD-10-CM

## 2016-07-04 ENCOUNTER — Ambulatory Visit: Payer: Medicare HMO | Admitting: Cardiology

## 2016-08-20 DIAGNOSIS — E785 Hyperlipidemia, unspecified: Secondary | ICD-10-CM | POA: Diagnosis not present

## 2016-08-20 DIAGNOSIS — N390 Urinary tract infection, site not specified: Secondary | ICD-10-CM | POA: Diagnosis not present

## 2016-08-20 DIAGNOSIS — I10 Essential (primary) hypertension: Secondary | ICD-10-CM | POA: Diagnosis not present

## 2016-08-20 DIAGNOSIS — R350 Frequency of micturition: Secondary | ICD-10-CM | POA: Diagnosis not present

## 2016-08-20 DIAGNOSIS — Z1211 Encounter for screening for malignant neoplasm of colon: Secondary | ICD-10-CM | POA: Diagnosis not present

## 2016-08-20 DIAGNOSIS — R739 Hyperglycemia, unspecified: Secondary | ICD-10-CM | POA: Diagnosis not present

## 2016-09-21 DIAGNOSIS — K5900 Constipation, unspecified: Secondary | ICD-10-CM | POA: Diagnosis not present

## 2016-09-21 DIAGNOSIS — M545 Low back pain: Secondary | ICD-10-CM | POA: Diagnosis not present

## 2016-09-22 ENCOUNTER — Emergency Department (HOSPITAL_COMMUNITY): Payer: Medicare HMO

## 2016-09-22 ENCOUNTER — Emergency Department (HOSPITAL_COMMUNITY)
Admission: EM | Admit: 2016-09-22 | Discharge: 2016-09-22 | Disposition: A | Discharge status: Home / Self Care | Payer: Medicare HMO | Attending: Emergency Medicine | Admitting: Emergency Medicine

## 2016-09-22 ENCOUNTER — Encounter (HOSPITAL_COMMUNITY): Payer: Self-pay

## 2016-09-22 DIAGNOSIS — Z7982 Long term (current) use of aspirin: Secondary | ICD-10-CM | POA: Insufficient documentation

## 2016-09-22 DIAGNOSIS — I10 Essential (primary) hypertension: Secondary | ICD-10-CM | POA: Insufficient documentation

## 2016-09-22 DIAGNOSIS — Z853 Personal history of malignant neoplasm of breast: Secondary | ICD-10-CM | POA: Diagnosis not present

## 2016-09-22 DIAGNOSIS — K5900 Constipation, unspecified: Secondary | ICD-10-CM

## 2016-09-22 DIAGNOSIS — R109 Unspecified abdominal pain: Secondary | ICD-10-CM | POA: Diagnosis not present

## 2016-09-22 LAB — COMPREHENSIVE METABOLIC PANEL
ALBUMIN: 3.9 g/dL (ref 3.5–5.0)
ALK PHOS: 87 U/L (ref 38–126)
ALT: 13 U/L — ABNORMAL LOW (ref 14–54)
ANION GAP: 9 (ref 5–15)
AST: 17 U/L (ref 15–41)
BILIRUBIN TOTAL: 0.7 mg/dL (ref 0.3–1.2)
BUN: 14 mg/dL (ref 6–20)
CALCIUM: 9.5 mg/dL (ref 8.9–10.3)
CO2: 27 mmol/L (ref 22–32)
Chloride: 103 mmol/L (ref 101–111)
Creatinine, Ser: 0.84 mg/dL (ref 0.44–1.00)
GLUCOSE: 109 mg/dL — AB (ref 65–99)
POTASSIUM: 3.6 mmol/L (ref 3.5–5.1)
Sodium: 139 mmol/L (ref 135–145)
TOTAL PROTEIN: 7.4 g/dL (ref 6.5–8.1)

## 2016-09-22 LAB — CBC
HEMATOCRIT: 34.8 % — AB (ref 36.0–46.0)
HEMOGLOBIN: 11.2 g/dL — AB (ref 12.0–15.0)
MCH: 28.1 pg (ref 26.0–34.0)
MCHC: 32.2 g/dL (ref 30.0–36.0)
MCV: 87.2 fL (ref 78.0–100.0)
Platelets: 319 10*3/uL (ref 150–400)
RBC: 3.99 MIL/uL (ref 3.87–5.11)
RDW: 13.1 % (ref 11.5–15.5)
WBC: 7.1 10*3/uL (ref 4.0–10.5)

## 2016-09-22 LAB — URINALYSIS, ROUTINE W REFLEX MICROSCOPIC
Bilirubin Urine: NEGATIVE
Glucose, UA: NEGATIVE mg/dL
HGB URINE DIPSTICK: NEGATIVE
KETONES UR: NEGATIVE mg/dL
NITRITE: NEGATIVE
Protein, ur: 100 mg/dL — AB
Specific Gravity, Urine: 1.016 (ref 1.005–1.030)
pH: 6 (ref 5.0–8.0)

## 2016-09-22 LAB — LIPASE, BLOOD: Lipase: 26 U/L (ref 11–51)

## 2016-09-22 MED ORDER — MORPHINE SULFATE (PF) 4 MG/ML IV SOLN
2.0000 mg | Freq: Once | INTRAVENOUS | Status: AC
  Administered 2016-09-22: 2 mg via INTRAVENOUS
  Filled 2016-09-22: qty 1

## 2016-09-22 MED ORDER — IOPAMIDOL (ISOVUE-300) INJECTION 61%
INTRAVENOUS | Status: AC
  Administered 2016-09-22: 100 mL via INTRAVENOUS
  Filled 2016-09-22: qty 100

## 2016-09-22 NOTE — ED Notes (Signed)
Patient transported to CT 

## 2016-09-22 NOTE — Discharge Instructions (Signed)
Please read attached information. If you experience any new or worsening signs or symptoms please return to the emergency room for evaluation. Please follow-up with your primary care provider or specialist as discussed.  Please use MiraLAX as directed.

## 2016-09-22 NOTE — ED Provider Notes (Signed)
[facility] DEPT Provider Note   CSN: [id number] Arrival date & time: 09/22/16  1431     History   Chief Complaint Chief Complaint  Patient presents with  . Abdominal Pain    HPI Kathryn Haley is a 76 y.o. female.  HPI   76 year old female presents today with complaints of abdominal discomfort and constipation.  Patient reports her last 4 days she has been unable to have a bowel movement.  She notes she continues to pass gas, feels slightly nauseous with no vomiting.  Patient notes that she took some milk of magnesia at home without symptomatic improvement.  Patient notes symptoms are worse on the right.  Leading discharge, dysuria.  Patient denies any rectal pain.  Patient denies any fever.  She has poor p.o. intake over the last several days.  Past Medical History:  Diagnosis Date  . Anxiety   . Arthritis   . Breast cancer ([place])   . Breast cancer of upper-outer quadrant of left female breast ([place]) 04/11/2014  . Family history of breast cancer   . Heart murmur    echo 2013-mild regurg-aortic,mitral,tricu  . Hypercholesteremia   . Hypertension   . Urinary frequency     Patient Active Problem List   Diagnosis Date Noted  . Family history of breast cancer   . Breast cancer of upper-outer quadrant of left female breast ([place]) 04/11/2014  . Syncope 05/27/2011  . HTN (hypertension) 05/27/2011  . Hyperlipidemia 05/27/2011    Past Surgical History:  Procedure Laterality Date  . BREAST EXCISIONAL BIOPSY    . BREAST LUMPECTOMY     left 2016  . BREAST LUMPECTOMY WITH NEEDLE LOCALIZATION AND AXILLARY SENTINEL LYMPH NODE BX Left 05/09/2014   Procedure: BRACKETED WIRE LOCALIZED LEFT BREAST LUMPECTOMY WITH LEFT  AXILLARY SENTINEL LYMPH NODE BX;  Surgeon: Excell Seltzer, MD;  Location: [facility];  Service: General;  Laterality: Left;  . BREAST SURGERY     lt breast bx x3-negative  . COLONOSCOPY    . EYE SURGERY     both cataracts  . TONSILLECTOMY      OB  History    No data available       Home Medications    Prior to Admission medications   Medication Sig Start Date End Date Taking? Authorizing Provider  amLODipine (NORVASC) 5 MG tablet Take 5 mg by mouth at bedtime.     [provider]  aspirin EC 81 MG tablet Take 81 mg by mouth daily.    [provider]  cholecalciferol (VITAMIN D) 1000 UNITS tablet Take 1 tablet (1,000 Units total) by mouth daily. 06/08/14   Magrinat, Virgie Dad, MD  cyanocobalamin 100 MCG tablet Take 100 mcg by mouth daily.    [provider]  ferrous sulfate 325 (65 FE) MG tablet Take 325 mg by mouth daily with breakfast.    [provider]  meclizine (ANTIVERT) 12.5 MG tablet Take 12.5 mg by mouth 2 (two) times daily as needed for dizziness.    [provider]  rosuvastatin (CRESTOR) 10 MG tablet Take 10 mg by mouth every evening. Pt takes Brand name of this medication    [provider]  triamcinolone cream (KENALOG) 0.1 % Apply 1 application topically 2 (two) times daily.    [provider]  verapamil (VERELAN PM) 240 MG 24 hr capsule Take 240 mg by mouth every morning.     [provider]    Family History Family History  Problem Relation Age of Onset  . Brain cancer Sister   . Breast cancer Daughter   . Breast cancer Daughter        youngest daughter possibly had genetic testing    Social History Social History  Substance Use Topics  . Smoking status: Never Smoker  . Smokeless tobacco: Never Used  . Alcohol use No     Allergies   Ciprofloxacin; Decadron [dexamethasone]; Other; Penicillins; Keflex [cephalexin]; and Toviaz [fesoterodine fumarate er]   Review of Systems Review of Systems  All other systems reviewed and are negative.    Physical Exam Updated Vital Signs BP (!) 179/85   Pulse 78   Temp 98.3 F (36.8 C) (Oral)   Resp 16   Ht 5\' 5"  (1.651 m)   Wt 68 kg (150 lb)   SpO2 96%   BMI 24.96 kg/m   Physical  Exam  Constitutional: She is oriented to person, place, and time. She appears well-developed and well-nourished.  HENT:  Head: Normocephalic and atraumatic.  Eyes: Pupils are equal, round, and reactive to light. Conjunctivae are normal. Right eye exhibits no discharge. Left eye exhibits no discharge. No scleral icterus.  Neck: Normal range of motion. No JVD present. No tracheal deviation present.  Pulmonary/Chest: Effort normal. No stridor.  Abdominal: Soft. She exhibits no distension and no mass. There is tenderness. There is no rebound and no guarding. No hernia.  Diffuse abdominal tenderness nonfocal  Neurological: She is alert and oriented to person, place, and time. Coordination normal.  Psychiatric: She has a normal mood and affect. Her behavior is normal. Judgment and thought content normal.  Nursing note and vitals reviewed.    ED Treatments / Results  Labs (all labs ordered are listed, but only abnormal results are displayed) Labs Reviewed  COMPREHENSIVE METABOLIC PANEL - Abnormal; Notable for the following:       Result Value   Glucose, Bld 109 (*)    ALT 13 (*)    All other components within normal limits  CBC - Abnormal; Notable for the following:    Hemoglobin 11.2 (*)    HCT 34.8 (*)    All other components within normal limits  URINALYSIS, ROUTINE W REFLEX MICROSCOPIC - Abnormal; Notable for the following:    Protein, ur 100 (*)    Leukocytes, UA TRACE (*)    Bacteria, UA RARE (*)    Squamous Epithelial / LPF 0-5 (*)    All other components within normal limits  LIPASE, BLOOD    EKG  EKG Interpretation None       Radiology Ct Abdomen Pelvis W Contrast  Result Date: 09/22/2016 CLINICAL DATA:  Right-sided abdominal pain. No bowel movement for 3 days. EXAM: CT ABDOMEN AND PELVIS WITH CONTRAST TECHNIQUE: Multidetector CT imaging of the abdomen and pelvis was performed using the standard protocol following bolus administration of intravenous contrast. CONTRAST:   196mL ISOVUE-300 IOPAMIDOL (ISOVUE-300) INJECTION 61% COMPARISON:  Abdominal CT 04/09/2016.  Abdominal MRI 04/24/2016 FINDINGS: Lower chest: Right basilar scarring and subpleural nodularity is unchanged. Coronary artery calcifications and atherosclerosis of the descending thoracic aorta. Hepatobiliary: Scattered calcified granulomas. No focal hepatic lesion. Gallbladder physiologically distended, no calcified stone. No biliary dilatation. Pancreas: No ductal dilatation or inflammation. Spleen: Normal in size without focal abnormality. Adrenals/Urinary Tract: No adrenal nodule. No hydronephrosis or perinephric edema. There are simple cysts in both kidneys, unchanged. Heterogeneous 12 mm complex lesion in the mid lateral left kidney is unchanged from prior. Urinary bladder is physiologically distended  without wall thickening. Stomach/Bowel: The stomach is nondistended. No small bowel inflammation or distention. Moderate colonic stool in the cecum, ascending, and proximal transverse colon. Transition in the mid transverse colon is nondistended with equivocal wall thickening in this region (series 3, image 34). Diverticulosis of the descending and sigmoid colon without acute inflammation. Scattered diverticular retain enteric contrast from prior exam. Normal appendix. Vascular/Lymphatic: Dense aortic and branch atherosclerosis. No aneurysm. No abdominal or pelvic adenopathy. Reproductive: Uterus and bilateral adnexa are unremarkable. Other: No free air, free fluid, or intra-abdominal fluid collection. Musculoskeletal: There are no acute or suspicious osseous abnormalities. Degenerative change throughout the spine and in both hips. IMPRESSION: 1. Moderate colonic stool burden in the right colon. There is a relative transition to decompressed colon in the distal transverse with a region of questionable wall thickening. This may be secondary to peristalsis, however colonic neoplasm is not excluded. Direct visualization  with colonoscopy could be considered if symptoms persist. 2. Otherwise no acute abnormality in the abdomen/pelvis. 3. Complex 12 mm left renal lesion is unchanged from CT and MRI earlier this year. 4. Aortic Atherosclerosis (ICD10-I70.0). Coronary artery calcifications. Electronically Signed   By: Jeb Levering M.D.   On: 09/22/2016 20:13    Procedures Procedures (including critical care time)  Medications Ordered in ED Medications  morphine 4 MG/ML injection 2 mg (not administered)  morphine 4 MG/ML injection 2 mg (2 mg Intravenous Given 09/22/16 1836)  iopamidol (ISOVUE-300) 61 % injection (100 mLs Intravenous Contrast Given 09/22/16 1935)     Initial Impression / Assessment and Plan / ED Course  I have reviewed the triage vital signs and the nursing notes.  Pertinent labs & imaging results that were available during my care of the patient were reviewed by me and considered in my medical decision making (see chart for details).     Final Clinical Impressions(s) / ED Diagnoses   Final diagnoses:  Constipation, unspecified constipation type    Labs: Urinalysis, lipase, CMP  Imaging: CT abdomen pelvis with contrast  Consults:  Therapeutics: Morphine  Discharge Meds: MiraLAX at home  Assessment/Plan: 76 year old female presents today with likely constipation.  Patient tolerating p.o. here.  Patient reports she has MiraLAX at home and will use this as instructed.  Patient CT scan results were read to her and the family showing questionable wall thickening indicating that neoplasm could not be excluded.  They will follow-up as an outpatient with gastroenterology for further evaluation and management.  They are given strict return precautions, both patient and the family verbalized understanding and agreement to today's plan had no further questions or concerns.      New Prescriptions New Prescriptions   No medications on file     Francee Gentile 09/22/16 2102      Pattricia Boss, MD 09/23/16 [phone]

## 2016-09-22 NOTE — ED Triage Notes (Signed)
Patient complains of generalized abdominal pain with nausea x 4 days. States she has ben constipated for same. Took MOM with no relief. Thinks related to the dye on new BP meds she started on Wednesday

## 2016-09-22 NOTE — ED Notes (Signed)
Patient Alert and oriented X4. Stable and ambulatory. Patient verbalized understanding of the discharge instructions.  Patient belongings were taken by the patient.  

## 2016-10-18 DIAGNOSIS — N3281 Overactive bladder: Secondary | ICD-10-CM | POA: Diagnosis not present

## 2016-10-18 DIAGNOSIS — I10 Essential (primary) hypertension: Secondary | ICD-10-CM | POA: Diagnosis not present

## 2016-10-18 DIAGNOSIS — R739 Hyperglycemia, unspecified: Secondary | ICD-10-CM | POA: Diagnosis not present

## 2016-10-18 DIAGNOSIS — E785 Hyperlipidemia, unspecified: Secondary | ICD-10-CM | POA: Diagnosis not present

## 2016-10-18 DIAGNOSIS — R350 Frequency of micturition: Secondary | ICD-10-CM | POA: Diagnosis not present

## 2016-11-13 DIAGNOSIS — I10 Essential (primary) hypertension: Secondary | ICD-10-CM | POA: Diagnosis not present

## 2016-11-13 DIAGNOSIS — R002 Palpitations: Secondary | ICD-10-CM | POA: Diagnosis not present

## 2016-11-13 DIAGNOSIS — R69 Illness, unspecified: Secondary | ICD-10-CM | POA: Diagnosis not present

## 2017-01-27 DIAGNOSIS — I10 Essential (primary) hypertension: Secondary | ICD-10-CM | POA: Diagnosis not present

## 2017-01-27 DIAGNOSIS — E785 Hyperlipidemia, unspecified: Secondary | ICD-10-CM | POA: Diagnosis not present

## 2017-01-27 DIAGNOSIS — R739 Hyperglycemia, unspecified: Secondary | ICD-10-CM | POA: Diagnosis not present

## 2017-01-27 DIAGNOSIS — D0500 Lobular carcinoma in situ of unspecified breast: Secondary | ICD-10-CM | POA: Diagnosis not present

## 2017-01-27 DIAGNOSIS — N3281 Overactive bladder: Secondary | ICD-10-CM | POA: Diagnosis not present

## 2017-01-27 DIAGNOSIS — M2550 Pain in unspecified joint: Secondary | ICD-10-CM | POA: Diagnosis not present

## 2017-04-04 DIAGNOSIS — M25511 Pain in right shoulder: Secondary | ICD-10-CM | POA: Diagnosis not present

## 2017-04-04 DIAGNOSIS — I10 Essential (primary) hypertension: Secondary | ICD-10-CM | POA: Diagnosis not present

## 2017-04-04 DIAGNOSIS — R350 Frequency of micturition: Secondary | ICD-10-CM | POA: Diagnosis not present

## 2017-04-04 DIAGNOSIS — N390 Urinary tract infection, site not specified: Secondary | ICD-10-CM | POA: Diagnosis not present

## 2017-04-07 ENCOUNTER — Other Ambulatory Visit: Payer: Self-pay | Admitting: Oncology

## 2017-04-07 DIAGNOSIS — Z853 Personal history of malignant neoplasm of breast: Secondary | ICD-10-CM

## 2017-05-22 DIAGNOSIS — M1990 Unspecified osteoarthritis, unspecified site: Secondary | ICD-10-CM | POA: Diagnosis not present

## 2017-05-22 DIAGNOSIS — Z853 Personal history of malignant neoplasm of breast: Secondary | ICD-10-CM | POA: Diagnosis not present

## 2017-05-22 DIAGNOSIS — Z8249 Family history of ischemic heart disease and other diseases of the circulatory system: Secondary | ICD-10-CM | POA: Diagnosis not present

## 2017-05-22 DIAGNOSIS — G8929 Other chronic pain: Secondary | ICD-10-CM | POA: Diagnosis not present

## 2017-05-22 DIAGNOSIS — Z823 Family history of stroke: Secondary | ICD-10-CM | POA: Diagnosis not present

## 2017-05-22 DIAGNOSIS — I10 Essential (primary) hypertension: Secondary | ICD-10-CM | POA: Diagnosis not present

## 2017-05-22 DIAGNOSIS — E785 Hyperlipidemia, unspecified: Secondary | ICD-10-CM | POA: Diagnosis not present

## 2017-05-22 DIAGNOSIS — R32 Unspecified urinary incontinence: Secondary | ICD-10-CM | POA: Diagnosis not present

## 2017-05-22 DIAGNOSIS — Z7722 Contact with and (suspected) exposure to environmental tobacco smoke (acute) (chronic): Secondary | ICD-10-CM | POA: Diagnosis not present

## 2017-05-22 DIAGNOSIS — M810 Age-related osteoporosis without current pathological fracture: Secondary | ICD-10-CM | POA: Diagnosis not present

## 2017-06-03 DIAGNOSIS — R350 Frequency of micturition: Secondary | ICD-10-CM | POA: Diagnosis not present

## 2017-06-03 DIAGNOSIS — E785 Hyperlipidemia, unspecified: Secondary | ICD-10-CM | POA: Diagnosis not present

## 2017-06-03 DIAGNOSIS — I10 Essential (primary) hypertension: Secondary | ICD-10-CM | POA: Diagnosis not present

## 2017-06-03 DIAGNOSIS — N3281 Overactive bladder: Secondary | ICD-10-CM | POA: Diagnosis not present

## 2017-06-10 DIAGNOSIS — H1031 Unspecified acute conjunctivitis, right eye: Secondary | ICD-10-CM | POA: Diagnosis not present

## 2017-07-04 IMAGING — MR MR ABDOMEN WO/W CM
17 series · 48 of 48 positions shown · IV contrast (multihance)
Comparison: CT abdomen/pelvis dated 04/09/2016

CLINICAL DATA: Follow-up left renal lesion on CT

EXAM:
MRI ABDOMEN WITHOUT AND WITH CONTRAST
TECHNIQUE: Multiplanar multisequence MR imaging of the abdomen was performed
both before and after the administration of intravenous contrast.
CONTRAST:  15mL MULTIHANCE GADOBENATE DIMEGLUMINE 529 MG/ML IV SOLN

[Series 3: T2 · coronal · 5.0mm · 1.56mm/px · 1 of 36 slices shown (1 of 3)]
[im 1/36]
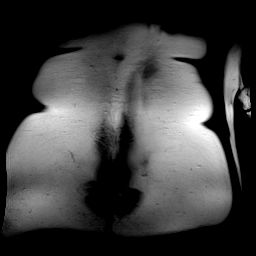

[Series 4: T1 · axial · 3.0mm · 1.19mm/px · z∈[-25,+188]mm · 5 of 144 slices shown]
[im 1/144]
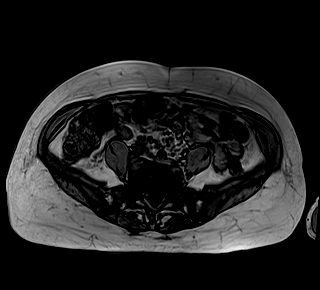
[im 36/144]
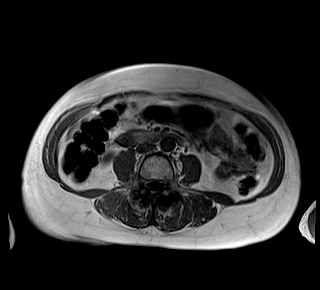
[im 72/144]
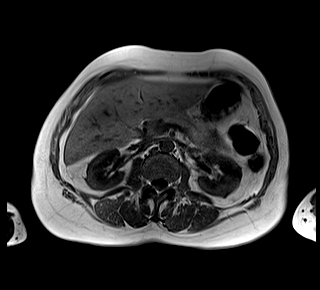
[im 108/144]
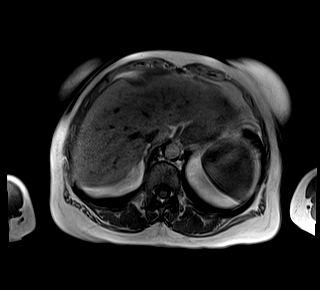
[im 144/144]
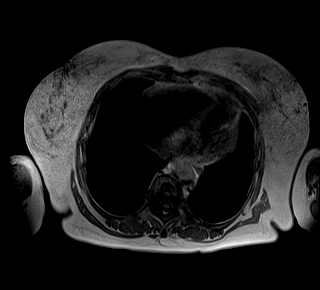

[Series 5: T2 · axial · 5.0mm · 1.48mm/px · z∈[+5,+227]mm · 2 of 38 slices shown (2 of 3)]
[im 1/38]
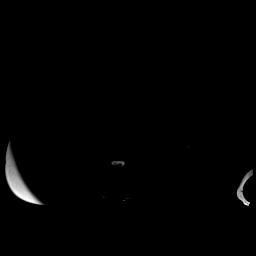
[im 38/38]
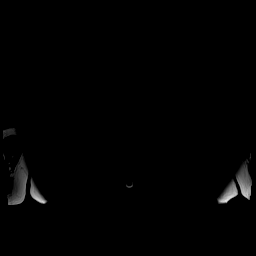

[Series 6: DWI · axial · 5.0mm · 1.42mm/px · z∈[+5,+227]mm · 5 of 114 slices shown (1 of 2)]
[im 1/114]
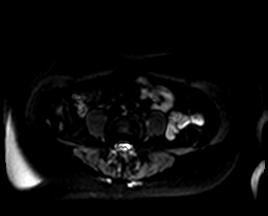
[im 29/114]
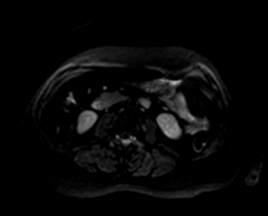
[im 57/114]
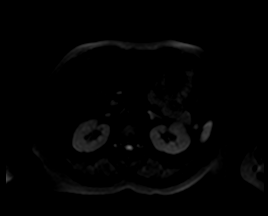
[im 85/114]
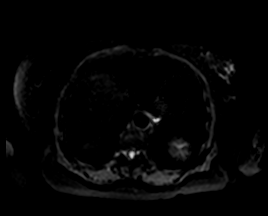
[im 114/114]
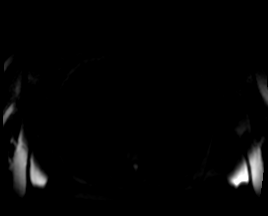

[Series 7: DWI · axial · 5.0mm · 1.42mm/px · z∈[+5,+227]mm · 2 of 38 slices shown (2 of 2)]
[im 1/38]
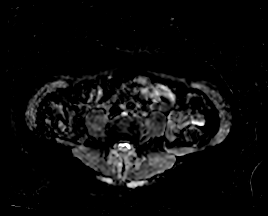
[im 38/38]
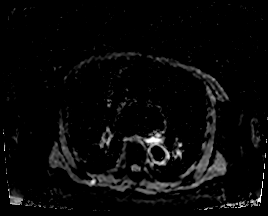

[Series 8: T2 · axial · 6.0mm · 1.19mm/px · 1 of 30 slices shown (3 of 3)]
[im 1/30]
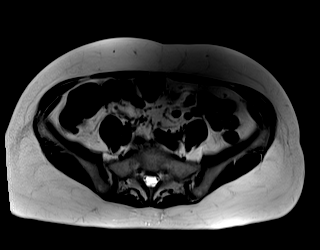

[Series 9: bSSFP · axial · 5.0mm · 1.25mm/px · z∈[-46,+176]mm · 2 of 38 slices shown]
[im 1/38]
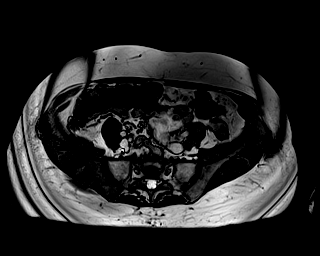
[im 38/38]
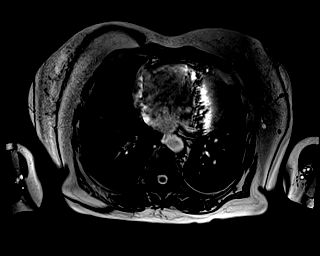

[Series 10: T1 dynamic · axial · non-contrast · 3.0mm · 1.25mm/px · z∈[-42,+171]mm · 3 of 72 slices shown]
[im 1/72]
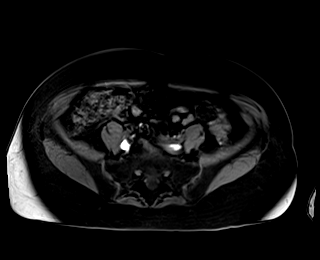
[im 36/72]
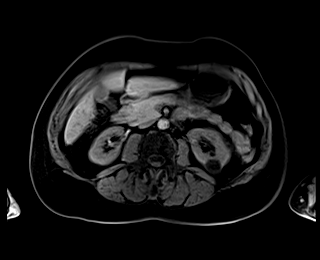
[im 72/72]
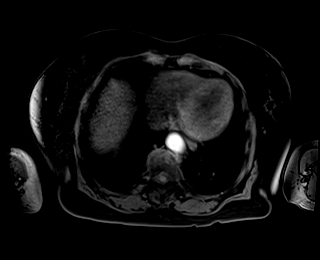

[Series 11: T1 dynamic post-contrast · axial · 3.0mm · 1.25mm/px · z∈[-42,+171]mm · 3 of 72 slices shown (1 of 9)]
[im 1/72]
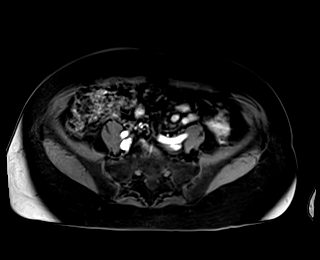
[im 36/72]
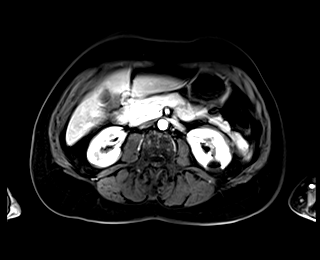
[im 72/72]
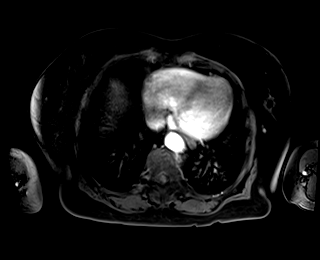

[Series 12: T1 dynamic post-contrast · axial · 3.0mm · 1.25mm/px · z∈[-42,+171]mm · 3 of 72 slices shown (2 of 9)]
[im 1/72]
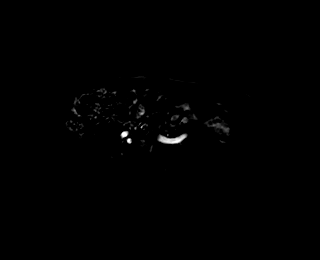
[im 36/72]
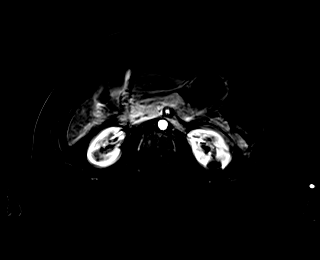
[im 72/72]
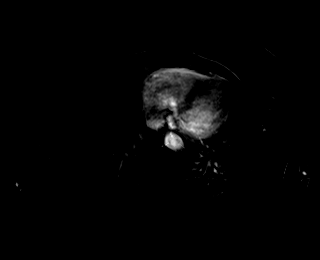

[Series 13: T1 dynamic post-contrast · axial · 3.0mm · 1.25mm/px · z∈[-42,+171]mm · 3 of 72 slices shown (3 of 9)]
[im 1/72]
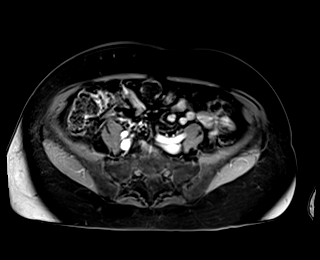
[im 36/72]
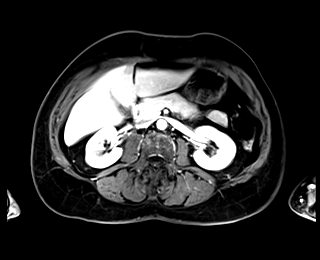
[im 72/72]
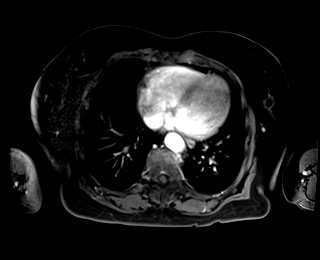

[Series 14: T1 dynamic post-contrast · axial · 3.0mm · 1.25mm/px · z∈[-42,+171]mm · 3 of 72 slices shown (4 of 9)]
[im 1/72]
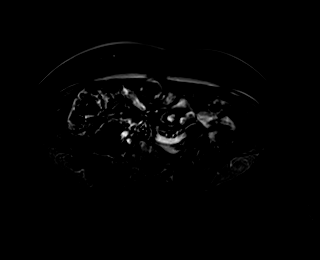
[im 36/72]
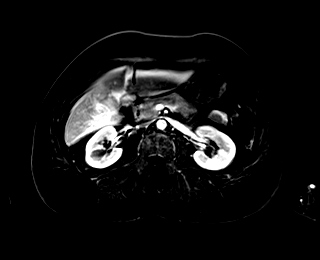
[im 72/72]
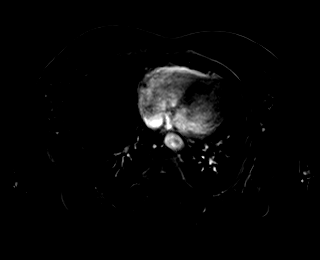

[Series 15: T1 dynamic post-contrast · axial · 3.0mm · 1.25mm/px · z∈[-42,+171]mm · 3 of 72 slices shown (5 of 9)]
[im 1/72]
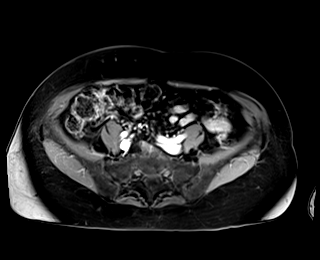
[im 36/72]
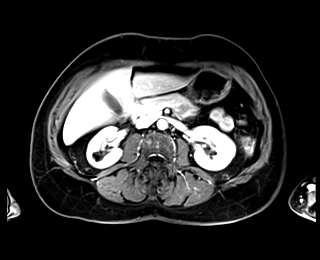
[im 72/72]
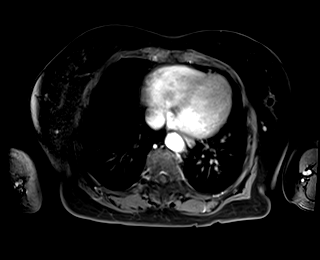

[Series 16: T1 dynamic post-contrast · axial · 3.0mm · 1.25mm/px · z∈[-42,+171]mm · 3 of 72 slices shown (6 of 9)]
[im 1/72]
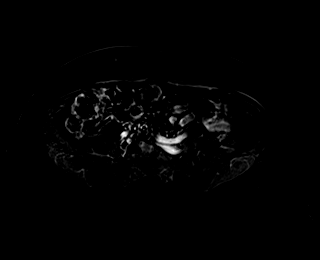
[im 36/72]
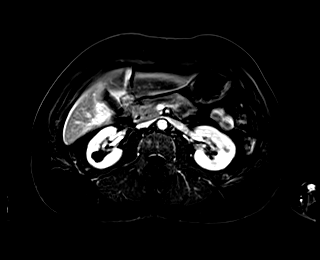
[im 72/72]
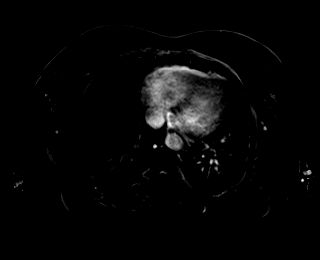

[Series 17: T1 dynamic post-contrast · coronal · 3.0mm · 1.19mm/px · 3 of 72 slices shown (7 of 9)]
[im 1/72]
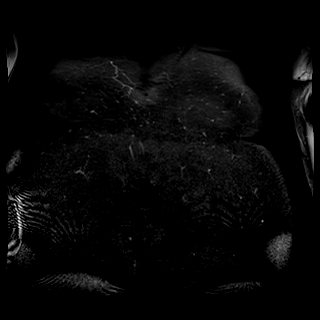
[im 36/72]
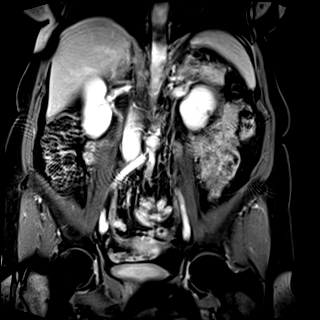
[im 72/72]
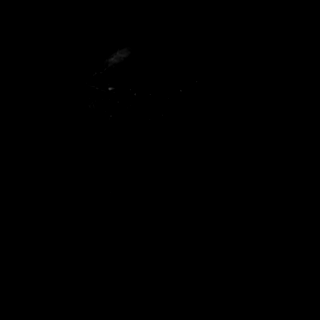

[Series 18: T1 dynamic post-contrast · axial · 3.0mm · 1.25mm/px · z∈[-42,+171]mm · 3 of 72 slices shown (8 of 9)]
[im 1/72]
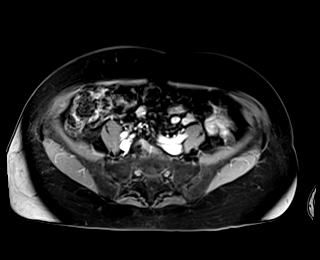
[im 36/72]
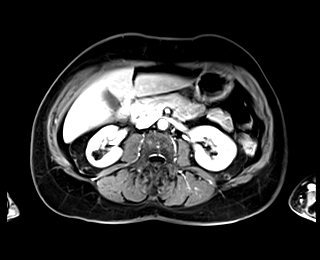
[im 72/72]
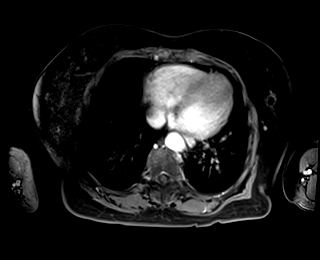

[Series 19: T1 dynamic post-contrast · axial · 3.0mm · 1.25mm/px · z∈[-42,+171]mm · 3 of 72 slices shown (9 of 9)]
[im 1/72]
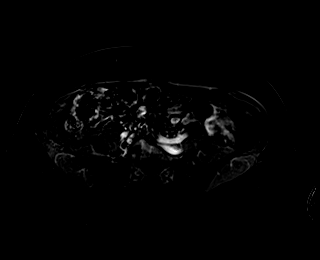
[im 36/72]
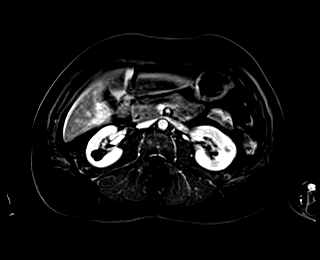
[im 72/72]
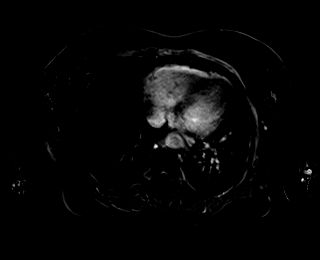

[48 of 48 positions shown; findings below may reference images not displayed]

FINDINGS: Lower chest: Lung bases are clear.

Hepatobiliary: Liver is within normal limits. No
suspicious/enhancing hepatic lesions. No hepatic steatosis.

Gallbladder is unremarkable. No intrahepatic or extrahepatic ductal
dilatation.

Pancreas:  Within normal limits.

Spleen:  Within normal limits.

Adrenals/Urinary Tract:  Adrenal glands are within normal limits.

Bilateral simple renal cysts, including a 2.1 cm posterior right
lower pole renal cyst (series 8/ image 16) and a 2.0 cm posterior
left lower pole renal cyst (series 8/ image 18).

11 mm T2 hyperintense lesion in the lateral interpolar left kidney
(series 5/ image 24), with a mildly thickened enhancing septation
(series 13/ image 39). By definition, this likely reflects a Bosniak
III lesion.

No hydronephrosis.

Stomach/Bowel: Stomach is within normal limits.

Visualized bowel is unremarkable.

Vascular/Lymphatic:  No evidence of abdominal aortic aneurysm.

No suspicious abdominal lymphadenopathy.

Other:  No abdominal ascites.

Musculoskeletal: Degenerative changes of the lumbar spine.
IMPRESSION: 11 mm cystic lesion in the lateral interpolar left kidney with an
enhancing thickened septation, cystic renal neoplasm not excluded
(Bosniak III).

Given the small size of the lesion, follow-up imaging in 12 months
may be appropriate if an aggressive approach is not desired.

## 2017-07-17 ENCOUNTER — Ambulatory Visit
Admission: RE | Admit: 2017-07-17 | Discharge: 2017-07-17 | Disposition: A | Discharge status: Home / Self Care | Payer: Medicare HMO | Source: Ambulatory Visit | Attending: Oncology | Admitting: Oncology

## 2017-07-17 DIAGNOSIS — R928 Other abnormal and inconclusive findings on diagnostic imaging of breast: Secondary | ICD-10-CM | POA: Diagnosis not present

## 2017-07-17 DIAGNOSIS — Z853 Personal history of malignant neoplasm of breast: Secondary | ICD-10-CM

## 2017-08-13 DIAGNOSIS — R109 Unspecified abdominal pain: Secondary | ICD-10-CM | POA: Diagnosis not present

## 2017-08-13 DIAGNOSIS — R739 Hyperglycemia, unspecified: Secondary | ICD-10-CM | POA: Diagnosis not present

## 2017-08-13 DIAGNOSIS — R42 Dizziness and giddiness: Secondary | ICD-10-CM | POA: Diagnosis not present

## 2017-08-13 DIAGNOSIS — I10 Essential (primary) hypertension: Secondary | ICD-10-CM | POA: Diagnosis not present

## 2017-08-13 DIAGNOSIS — L309 Dermatitis, unspecified: Secondary | ICD-10-CM | POA: Diagnosis not present

## 2017-08-13 DIAGNOSIS — E785 Hyperlipidemia, unspecified: Secondary | ICD-10-CM | POA: Diagnosis not present

## 2017-10-21 DIAGNOSIS — Z23 Encounter for immunization: Secondary | ICD-10-CM | POA: Diagnosis not present

## 2017-10-21 DIAGNOSIS — R69 Illness, unspecified: Secondary | ICD-10-CM | POA: Diagnosis not present

## 2017-10-21 DIAGNOSIS — R109 Unspecified abdominal pain: Secondary | ICD-10-CM | POA: Diagnosis not present

## 2017-10-21 DIAGNOSIS — D0500 Lobular carcinoma in situ of unspecified breast: Secondary | ICD-10-CM | POA: Diagnosis not present

## 2017-10-28 ENCOUNTER — Other Ambulatory Visit: Payer: Self-pay | Admitting: Oncology

## 2017-11-05 ENCOUNTER — Telehealth: Payer: Self-pay | Admitting: Oncology

## 2017-11-05 NOTE — Telephone Encounter (Signed)
Scheduled appt on 10/31 per GM okay to schedule pt next available.

## 2017-11-19 ENCOUNTER — Other Ambulatory Visit: Payer: Self-pay

## 2017-11-19 DIAGNOSIS — C50412 Malignant neoplasm of upper-outer quadrant of left female breast: Secondary | ICD-10-CM

## 2017-11-20 ENCOUNTER — Inpatient Hospital Stay: Payer: Medicare HMO | Attending: Oncology | Admitting: Oncology

## 2017-11-20 ENCOUNTER — Inpatient Hospital Stay: Payer: Medicare HMO

## 2017-11-20 VITALS — BP 166/78 | HR 66 | Temp 98.6°F | Resp 18 | Ht 65.0 in | Wt 146.8 lb

## 2017-11-20 DIAGNOSIS — I10 Essential (primary) hypertension: Secondary | ICD-10-CM | POA: Diagnosis not present

## 2017-11-20 DIAGNOSIS — R011 Cardiac murmur, unspecified: Secondary | ICD-10-CM | POA: Insufficient documentation

## 2017-11-20 DIAGNOSIS — M129 Arthropathy, unspecified: Secondary | ICD-10-CM

## 2017-11-20 DIAGNOSIS — Z853 Personal history of malignant neoplasm of breast: Secondary | ICD-10-CM | POA: Diagnosis not present

## 2017-11-20 DIAGNOSIS — Z7982 Long term (current) use of aspirin: Secondary | ICD-10-CM | POA: Diagnosis not present

## 2017-11-20 DIAGNOSIS — R634 Abnormal weight loss: Secondary | ICD-10-CM | POA: Diagnosis not present

## 2017-11-20 DIAGNOSIS — R69 Illness, unspecified: Secondary | ICD-10-CM | POA: Diagnosis not present

## 2017-11-20 DIAGNOSIS — Z803 Family history of malignant neoplasm of breast: Secondary | ICD-10-CM | POA: Insufficient documentation

## 2017-11-20 DIAGNOSIS — F419 Anxiety disorder, unspecified: Secondary | ICD-10-CM | POA: Diagnosis not present

## 2017-11-20 DIAGNOSIS — E7800 Pure hypercholesterolemia, unspecified: Secondary | ICD-10-CM | POA: Diagnosis not present

## 2017-11-20 DIAGNOSIS — C50412 Malignant neoplasm of upper-outer quadrant of left female breast: Secondary | ICD-10-CM

## 2017-11-20 DIAGNOSIS — Z79899 Other long term (current) drug therapy: Secondary | ICD-10-CM | POA: Diagnosis not present

## 2017-11-20 DIAGNOSIS — Z170 Estrogen receptor positive status [ER+]: Secondary | ICD-10-CM

## 2017-11-20 LAB — CMP (CANCER CENTER ONLY)
ALT: 9 U/L (ref 0–44)
AST: 14 U/L — ABNORMAL LOW (ref 15–41)
Albumin: 3.8 g/dL (ref 3.5–5.0)
Alkaline Phosphatase: 108 U/L (ref 38–126)
Anion gap: 7 (ref 5–15)
BILIRUBIN TOTAL: 0.6 mg/dL (ref 0.3–1.2)
BUN: 15 mg/dL (ref 8–23)
CHLORIDE: 107 mmol/L (ref 98–111)
CO2: 29 mmol/L (ref 22–32)
CREATININE: 1 mg/dL (ref 0.44–1.00)
Calcium: 10.2 mg/dL (ref 8.9–10.3)
GFR, EST NON AFRICAN AMERICAN: 53 mL/min — AB (ref >60)
Glucose, Bld: 92 mg/dL (ref 70–99)
Potassium: 4 mmol/L (ref 3.5–5.1)
Sodium: 143 mmol/L (ref 135–145)
TOTAL PROTEIN: 7.6 g/dL (ref 6.5–8.1)

## 2017-11-20 LAB — CBC WITH DIFFERENTIAL (CANCER CENTER ONLY)
ABS IMMATURE GRANULOCYTES: 0 10*3/uL (ref 0.00–0.07)
BASOS PCT: 1 %
Basophils Absolute: 0.1 10*3/uL (ref 0.0–0.1)
EOS ABS: 0.4 10*3/uL (ref 0.0–0.5)
Eosinophils Relative: 6 %
HCT: 35.5 % — ABNORMAL LOW (ref 36.0–46.0)
Hemoglobin: 11.3 g/dL — ABNORMAL LOW (ref 12.0–15.0)
Immature Granulocytes: 0 %
Lymphocytes Relative: 44 %
Lymphs Abs: 2.5 10*3/uL (ref 0.7–4.0)
MCH: 28.3 pg (ref 26.0–34.0)
MCHC: 31.8 g/dL (ref 30.0–36.0)
MCV: 89 fL (ref 80.0–100.0)
MONOS PCT: 8 %
Monocytes Absolute: 0.5 10*3/uL (ref 0.1–1.0)
Neutro Abs: 2.3 10*3/uL (ref 1.7–7.7)
Neutrophils Relative %: 41 %
PLATELETS: 290 10*3/uL (ref 150–400)
RBC: 3.99 MIL/uL (ref 3.87–5.11)
RDW: 13.3 % (ref 11.5–15.5)
WBC Count: 5.6 10*3/uL (ref 4.0–10.5)
nRBC: 0 % (ref 0.0–0.2)

## 2017-11-20 MED ORDER — MIRTAZAPINE 15 MG PO TABS: 15.0000 mg | ORAL_TABLET | Freq: Every day | ORAL | 4 refills | Status: DC

## 2017-11-20 NOTE — Progress Notes (Signed)
[facility]  Telephone:([phone]) [phone] Fax:(336) [phone]     ID: Kathryn Haley DOB: Jun 05, 1940  MR#: [id number]  SHU#:[id number]  Patient Care Team: London Pepper, MD as PCP - General (Family Medicine) Excell Seltzer, MD as Consulting Physician (General Surgery) Magrinat, Virgie Dad, MD as Consulting Physician (Oncology) Thea Silversmith, MD as Consulting Physician (Radiation Oncology) Mauro Kaufmann, RN as Registered Nurse Rockwell Germany, RN as Registered Nurse Jake Shark Johny Blamer, NP as Nurse Practitioner (Hematology and Oncology) PCP: London Pepper, MD OTHER MD: Nicki Reaper McDiarmid M.D., Dr London Pepper  CHIEF COMPLAINT: Estrogen receptor positive lobular breast cancer  CURRENT TREATMENT: Observation   BREAST CANCER HISTORY: From the original intake note:  The patient had routine breast cancer screening at [facility] 02/24/2014. This showed the breast density to be category B. Some irregularities in the left breast appeared suspicious and the patient was recalled for unilateral [facility] March 11 2014. This showed scar tissue in the upper outer left breast corresponding to an area of surgical scar. (The patient tells me she had 3 cysts removed remotely from her left breast, but were benign). The patient has a history of prior benign excisional left breast biopsy. There were also 3 groups of calcifications in a linear fashion spanning an area of 4.9 cm.  Biopsy of the left breast upper outer quadrant 04/04/2014 showed ([id number]) invasive lobular carcinoma (E-cadherin negative) grade 2, estrogen receptor 99% positive, progesterone receptor 44% positive, both with strong staining intensity, with an MIB-1 of 16%, and no HER-2 amplification, the signals ratio being 1.06 and the number per cell 1.80.  On 04/13/2014 the patient underwent bilateral breast MRI showing in the left breast at the 3:00 location an area of  irregular clumped nodular enhancement measuring 4.5 cm. There was clip artifact at the anterior aspect of this. There were no other areas of concern in the left breast, and no abnormal appearing lymph nodes. The right breast was unremarkable.  Her subsequent history is as detailed below  INTERVAL HISTORY: Kathryn Haley is here today for follow up of her estrogen receptor positive lobular breast cancer. She has been doing well overall, however, she notes weight loss, which she is concerned about as she would like to gain weight.   Since her last visit to the office, she underwent a diagnostic bilateral mammogram on 07/17/2017 at [facility] showed: Breast density category B. No evidence of malignancy.    REVIEW OF SYSTEMS: Kathryn Haley reports that for exercise, she has been going to the gym and participating in [employer]. She also has a stationary foot pedal station at home that she can transform to use for her arms as well. She is able to cook her own meals at home, which she enjoys doing. She denies unusual headaches, visual changes, nausea, vomiting, or dizziness. There has been no unusual cough, phlegm production, or pleurisy. This been no change in bowel or bladder habits. She denies unexplained fatigue or unexplained weight loss, bleeding, rash, or fever. A detailed review of systems was otherwise stable.    PAST MEDICAL HISTORY: Past Medical History:  Diagnosis Date  . Anxiety   . Arthritis   . Breast cancer ([place])   . Breast cancer of upper-outer quadrant of left female breast ([place]) 04/11/2014  . Family history of breast cancer   . Heart murmur    echo 2013-mild regurg-aortic,mitral,tricu  . Hypercholesteremia   . Hypertension   . Urinary frequency  PAST SURGICAL HISTORY: Past Surgical History:  Procedure Laterality Date  . BREAST EXCISIONAL BIOPSY    . BREAST LUMPECTOMY     left 2016  . BREAST LUMPECTOMY WITH NEEDLE LOCALIZATION AND AXILLARY SENTINEL LYMPH NODE BX Left  05/09/2014   Procedure: BRACKETED WIRE LOCALIZED LEFT BREAST LUMPECTOMY WITH LEFT  AXILLARY SENTINEL LYMPH NODE BX;  Surgeon: Excell Seltzer, MD;  Location: [facility];  Service: General;  Laterality: Left;  . BREAST SURGERY     lt breast bx x3-negative  . COLONOSCOPY    . EYE SURGERY     both cataracts  . TONSILLECTOMY      FAMILY HISTORY Family History  Problem Relation Age of Onset  . Brain cancer Sister   . Breast cancer Daughter   . Breast cancer Daughter        youngest daughter possibly had genetic testing   patient's father died from a heart attack at age 78. The patient's mother died from heart failure at age 8. The patient had one brother, 2 sisters. One of her sisters died from melanoma metastatic to the brain in 2007 area the patient has 3 daughters, 2 of them were diagnosed with breast cancer around the age of 51. They both live in [place] and both are doing well. She does not know whether they were genetically tested. There is no other history of breast or ovarian cancer in the family to the patient's knowledge.  GYNECOLOGIC HISTORY:  No LMP recorded. Patient is postmenopausal. Menarche age 74, first live birth age 41, [name] P3. She stopped having periods around age 62. She did not take hormone replacement.  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 She is separated and lives alone, with no pets. She has no family in this area other than a nephew, Kathryn Haley, who lives 3 hours away the patient's children are well in the [place] who lives in [place] and is disabled; Kathryn Haley who lives in [place] and works for [employer]; and Kathryn Haley, who also lives in [place] and is a homemaker. [place] and Freda Munro are the 2 daughters who had breast cancer in their early 10s the patient attends a local [place]: In place, and being updated through a  lawyer per the patient. In case of an accident she requests we call Kathryn Haley , close friend, at [phone]   HEALTH MAINTENANCE: Social History   Tobacco Use  . Smoking status: Never Smoker  . Smokeless tobacco: Never Used  Substance Use Topics  . Alcohol use: No  . Drug use: No     Colonoscopy: In [place], 2008  PAP: 2014  Bone density: Never  Lipid panel:  Allergies  Allergen Reactions  . Ciprofloxacin Shortness Of Breath  . Decadron [Dexamethasone] Anaphylaxis, Swelling and Rash    Swelling of lips and tongue  . Other Anaphylaxis    Patient had antibiotic shot in a MD office , does not know what name of drug is.  . Penicillins Nausea And Vomiting    VOMITTING  . Keflex [Cephalexin] Rash    SKIN RASH  . Toviaz [Fesoterodine Fumarate Er] Rash and Other (See Comments)    DIZZINESS    Current Outpatient Medications  Medication Sig Dispense Refill  . amLODipine (NORVASC) 5 MG tablet Take 5 mg by mouth at bedtime.     Marland Kitchen aspirin  EC 81 MG tablet Take 81 mg by mouth daily.    . cholecalciferol (VITAMIN D) 1000 UNITS tablet Take 1 tablet (1,000 Units total) by mouth daily. 100 tablet 12  . cyanocobalamin 100 MCG tablet Take 100 mcg by mouth daily.    . ferrous sulfate 325 (65 FE) MG tablet Take 325 mg by mouth daily with breakfast.    . meclizine (ANTIVERT) 12.5 MG tablet Take 12.5 mg by mouth 2 (two) times daily as needed for dizziness.    . rosuvastatin (CRESTOR) 10 MG tablet Take 10 mg by mouth every evening. Pt takes Brand name of this medication    . triamcinolone cream (KENALOG) 0.1 % Apply 1 application topically 2 (two) times daily.    . verapamil (VERELAN PM) 240 MG 24 hr capsule Take 240 mg by mouth every morning.      No current facility-administered medications for this visit.     OBJECTIVE: Middle-aged African-American woman who appears stated age 89:   11/20/17 1405  BP: (!) 166/78  Pulse: 66  Resp: 18  Temp: 98.6 F (37 C)  SpO2: 98%      Body mass index is 24.43 kg/m.    ECOG FS:1 - Symptomatic but completely ambulatory Weight change over the last 12 months is 4 pounds less  Sclerae unicteric, EOMs intact No cervical or supraclavicular adenopathy Lungs no rales or rhonchi Heart regular rate and rhythm Abd soft, nontender, positive bowel sounds MSK no focal spinal tenderness, no upper extremity lymphedema Neuro: nonfocal, well oriented, appropriate affect Breasts: The right breast is unremarkable.  The left breast is status post lumpectomy without radiation.  There is no evidence of disease recurrence.  Both axillae are benign.   LAB RESULTS:  CMP     Component Value Date/Time   NA 139 09/22/2016 1504   NA 142 03/19/2016 0940   K 3.6 09/22/2016 1504   K 3.8 03/19/2016 0940   CL 103 09/22/2016 1504   CO2 27 09/22/2016 1504   CO2 26 03/19/2016 0940   GLUCOSE 109 (H) 09/22/2016 1504   GLUCOSE 99 03/19/2016 0940   BUN 14 09/22/2016 1504   BUN 16.6 03/19/2016 0940   CREATININE 0.84 09/22/2016 1504   CREATININE 0.9 03/19/2016 0940   CALCIUM 9.5 09/22/2016 1504   CALCIUM 9.9 03/19/2016 0940   PROT 7.4 09/22/2016 1504   PROT 7.6 03/19/2016 0940   ALBUMIN 3.9 09/22/2016 1504   ALBUMIN 3.9 03/19/2016 0940   AST 17 09/22/2016 1504   AST 14 03/19/2016 0940   ALT 13 (L) 09/22/2016 1504   ALT 13 03/19/2016 0940   ALKPHOS 87 09/22/2016 1504   ALKPHOS 111 03/19/2016 0940   BILITOT 0.7 09/22/2016 1504   BILITOT 0.40 03/19/2016 0940   GFRNONAA >60 09/22/2016 1504   GFRAA >60 09/22/2016 1504    INo results found for: SPEP, UPEP  Lab Results  Component Value Date   WBC 5.6 11/20/2017   NEUTROABS 2.3 11/20/2017   HGB 11.3 (L) 11/20/2017   HCT 35.5 (L) 11/20/2017   MCV 89.0 11/20/2017   PLT 290 11/20/2017      Chemistry      Component Value Date/Time   NA 139 09/22/2016 1504   NA 142 03/19/2016 0940   K 3.6 09/22/2016 1504   K 3.8 03/19/2016 0940   CL 103 09/22/2016 1504   CO2 27 09/22/2016 1504   CO2  26 03/19/2016 0940   BUN 14 09/22/2016 1504   BUN 16.6 03/19/2016 0940  CREATININE 0.84 09/22/2016 1504   CREATININE 0.9 03/19/2016 0940      Component Value Date/Time   CALCIUM 9.5 09/22/2016 1504   CALCIUM 9.9 03/19/2016 0940   ALKPHOS 87 09/22/2016 1504   ALKPHOS 111 03/19/2016 0940   AST 17 09/22/2016 1504   AST 14 03/19/2016 0940   ALT 13 (L) 09/22/2016 1504   ALT 13 03/19/2016 0940   BILITOT 0.7 09/22/2016 1504   BILITOT 0.40 03/19/2016 0940       No results found for: LABCA2  No components found for: LABCA125  No results for input(s): INR in the last 168 hours.  Urinalysis    Component Value Date/Time   COLORURINE YELLOW 09/22/2016 1522   APPEARANCEUR CLEAR 09/22/2016 1522   LABSPEC 1.016 09/22/2016 1522   PHURINE 6.0 09/22/2016 1522   GLUCOSEU NEGATIVE 09/22/2016 1522   HGBUR NEGATIVE 09/22/2016 1522   BILIRUBINUR NEGATIVE 09/22/2016 1522   KETONESUR NEGATIVE 09/22/2016 1522   PROTEINUR 100 (A) 09/22/2016 1522   UROBILINOGEN 0.2 05/27/2011 1706   NITRITE NEGATIVE 09/22/2016 1522   LEUKOCYTESUR TRACE (A) 09/22/2016 1522    STUDIES: No results found.   ASSESSMENT: 77 y.o. [place] woman status post left breast upper outer quadrant biopsy 04/04/2014 for a clinical T2 N0, stage IIA invasive lobular carcinoma, estrogen receptor 99% positive, progesterone receptor 44% positive, with an MIB-1 of 16% and no HER-2 amplification.  (1) status post left lumpectomy and sentinel lymph node sampling 05/09/2014 for a pT1c pN0, stage IA invasive lobular carcinoma, grade 2, with repeat HER-2 again negative. Margins were clear  (2) Oncotype DX score of 8 predicts a risk of recurrence outside the breast of 6% within 10 years if the patient's only systemic therapy is tamoxifen for 5 years. It also predicts no benefit from chemotherapy.  (3) the patient opted against adjuvant radiation  (4) anastrozole started 06/28/2014, stopped 07/15/2014.   (5) tamoxifen begun August  2016, stopped after 2 weeks with significant side effects  (6) updated genetics testing of the patient's daughters suggested  PLAN:  Oktober is now over 3 years out from definitive surgery for breast cancer with no evidence of disease recurrence.  This is very favorable.  She continues to be concerned about her weight loss but her weight essentially has been stable over the last year.  She has an excellent exercise program.  Her diet is not bad.  Nevertheless if she wishes to have weight gain, and also because she complains of stress related to family, I have written for Remeron for her to take at bedtime.  She has a good understanding of the possible toxicity side effects and complications of this agent and she will call me if she has any problems.  We also reviewed her mammography today which was favorable.  Otherwise I will plan to see her again in 1 year.  She knows to call for any issues that may develop before that visit.   Magrinat, Virgie Dad, MD  11/20/17 2:33 PM Medical Oncology and Hematology [facility] [address] [place], [place] [address] Tel. [phone]    Fax. [phone]    I, Soijett Blue am acting as scribe for Dr. Sarajane Jews C. Magrinat. I, Lurline Del MD, have reviewed the above documentation for accuracy and completeness, and I agree with the above.

## 2017-11-21 ENCOUNTER — Telehealth: Payer: Self-pay | Admitting: Oncology

## 2017-11-21 NOTE — Telephone Encounter (Signed)
Per 10/31 los.  Mailed patient appt calendar for 11/26/2018 visit.

## 2018-02-04 DIAGNOSIS — M25561 Pain in right knee: Secondary | ICD-10-CM | POA: Diagnosis not present

## 2018-02-04 DIAGNOSIS — J988 Other specified respiratory disorders: Secondary | ICD-10-CM | POA: Diagnosis not present

## 2018-05-26 DIAGNOSIS — R7309 Other abnormal glucose: Secondary | ICD-10-CM | POA: Diagnosis not present

## 2018-05-26 DIAGNOSIS — I10 Essential (primary) hypertension: Secondary | ICD-10-CM | POA: Diagnosis not present

## 2018-05-26 DIAGNOSIS — M79643 Pain in unspecified hand: Secondary | ICD-10-CM | POA: Diagnosis not present

## 2018-05-26 DIAGNOSIS — Z79899 Other long term (current) drug therapy: Secondary | ICD-10-CM | POA: Diagnosis not present

## 2018-05-26 DIAGNOSIS — D649 Anemia, unspecified: Secondary | ICD-10-CM | POA: Diagnosis not present

## 2018-07-07 ENCOUNTER — Other Ambulatory Visit: Payer: Self-pay | Admitting: Oncology

## 2018-07-07 DIAGNOSIS — Z9889 Other specified postprocedural states: Secondary | ICD-10-CM

## 2018-07-09 DIAGNOSIS — Z1159 Encounter for screening for other viral diseases: Secondary | ICD-10-CM | POA: Diagnosis not present

## 2018-07-21 ENCOUNTER — Other Ambulatory Visit: Payer: Self-pay

## 2018-07-21 ENCOUNTER — Ambulatory Visit
Admission: RE | Admit: 2018-07-21 | Discharge: 2018-07-21 | Disposition: A | Discharge status: Home / Self Care | Payer: Medicare HMO | Source: Ambulatory Visit | Attending: Oncology | Admitting: Oncology

## 2018-07-21 DIAGNOSIS — Z9889 Other specified postprocedural states: Secondary | ICD-10-CM

## 2018-07-21 DIAGNOSIS — Z853 Personal history of malignant neoplasm of breast: Secondary | ICD-10-CM | POA: Diagnosis not present

## 2018-07-21 DIAGNOSIS — R928 Other abnormal and inconclusive findings on diagnostic imaging of breast: Secondary | ICD-10-CM | POA: Diagnosis not present

## 2018-08-19 DIAGNOSIS — H1132 Conjunctival hemorrhage, left eye: Secondary | ICD-10-CM | POA: Diagnosis not present

## 2018-09-22 DIAGNOSIS — Z23 Encounter for immunization: Secondary | ICD-10-CM | POA: Diagnosis not present

## 2018-09-22 DIAGNOSIS — E785 Hyperlipidemia, unspecified: Secondary | ICD-10-CM | POA: Diagnosis not present

## 2018-09-22 DIAGNOSIS — M25569 Pain in unspecified knee: Secondary | ICD-10-CM | POA: Diagnosis not present

## 2018-09-22 DIAGNOSIS — M25519 Pain in unspecified shoulder: Secondary | ICD-10-CM | POA: Diagnosis not present

## 2018-09-22 DIAGNOSIS — M25561 Pain in right knee: Secondary | ICD-10-CM | POA: Diagnosis not present

## 2018-09-22 DIAGNOSIS — R739 Hyperglycemia, unspecified: Secondary | ICD-10-CM | POA: Diagnosis not present

## 2018-09-22 DIAGNOSIS — R42 Dizziness and giddiness: Secondary | ICD-10-CM | POA: Diagnosis not present

## 2018-09-22 DIAGNOSIS — M549 Dorsalgia, unspecified: Secondary | ICD-10-CM | POA: Diagnosis not present

## 2018-11-25 ENCOUNTER — Telehealth: Payer: Self-pay | Admitting: Oncology

## 2018-11-25 NOTE — Telephone Encounter (Signed)
Returned patient's phone call regarding rescheduling an appointment, per patient's request 11/05 appointment has been rescheduled for 11/12.

## 2018-11-26 ENCOUNTER — Ambulatory Visit: Payer: Medicare HMO | Admitting: Oncology

## 2018-11-26 ENCOUNTER — Other Ambulatory Visit: Payer: Medicare HMO

## 2018-11-30 DIAGNOSIS — R8290 Unspecified abnormal findings in urine: Secondary | ICD-10-CM | POA: Diagnosis not present

## 2018-11-30 DIAGNOSIS — S8991XA Unspecified injury of right lower leg, initial encounter: Secondary | ICD-10-CM | POA: Diagnosis not present

## 2018-11-30 DIAGNOSIS — I10 Essential (primary) hypertension: Secondary | ICD-10-CM | POA: Diagnosis not present

## 2018-12-02 ENCOUNTER — Other Ambulatory Visit: Payer: Self-pay

## 2018-12-02 DIAGNOSIS — C50412 Malignant neoplasm of upper-outer quadrant of left female breast: Secondary | ICD-10-CM

## 2018-12-03 ENCOUNTER — Inpatient Hospital Stay: Payer: Medicare HMO | Admitting: Oncology

## 2018-12-03 ENCOUNTER — Inpatient Hospital Stay: Payer: Medicare HMO

## 2019-01-04 ENCOUNTER — Emergency Department (HOSPITAL_COMMUNITY): Admission: EM | Admit: 2019-01-04 | Discharge: 2019-01-04 | Discharge status: Absent without leave | Payer: Medicare HMO

## 2019-01-04 ENCOUNTER — Inpatient Hospital Stay: Payer: Medicare HMO | Attending: Oncology

## 2019-01-04 ENCOUNTER — Other Ambulatory Visit: Payer: Self-pay

## 2019-01-04 ENCOUNTER — Inpatient Hospital Stay (HOSPITAL_BASED_OUTPATIENT_CLINIC_OR_DEPARTMENT_OTHER): Payer: Medicare HMO | Admitting: Oncology

## 2019-01-04 VITALS — BP 132/74 | HR 90 | Temp 98.2°F | Resp 18 | Ht 65.0 in | Wt 137.9 lb

## 2019-01-04 DIAGNOSIS — Z803 Family history of malignant neoplasm of breast: Secondary | ICD-10-CM | POA: Insufficient documentation

## 2019-01-04 DIAGNOSIS — C50412 Malignant neoplasm of upper-outer quadrant of left female breast: Secondary | ICD-10-CM

## 2019-01-04 DIAGNOSIS — I10 Essential (primary) hypertension: Secondary | ICD-10-CM | POA: Insufficient documentation

## 2019-01-04 DIAGNOSIS — Z7982 Long term (current) use of aspirin: Secondary | ICD-10-CM | POA: Insufficient documentation

## 2019-01-04 DIAGNOSIS — Z79899 Other long term (current) drug therapy: Secondary | ICD-10-CM | POA: Insufficient documentation

## 2019-01-04 DIAGNOSIS — F419 Anxiety disorder, unspecified: Secondary | ICD-10-CM | POA: Insufficient documentation

## 2019-01-04 DIAGNOSIS — E7800 Pure hypercholesterolemia, unspecified: Secondary | ICD-10-CM | POA: Insufficient documentation

## 2019-01-04 DIAGNOSIS — Z170 Estrogen receptor positive status [ER+]: Secondary | ICD-10-CM | POA: Insufficient documentation

## 2019-01-04 DIAGNOSIS — R69 Illness, unspecified: Secondary | ICD-10-CM | POA: Diagnosis not present

## 2019-01-04 LAB — CBC WITH DIFFERENTIAL (CANCER CENTER ONLY)
Abs Immature Granulocytes: 0.01 10*3/uL (ref 0.00–0.07)
Basophils Absolute: 0 10*3/uL (ref 0.0–0.1)
Basophils Relative: 1 %
Eosinophils Absolute: 0.3 10*3/uL (ref 0.0–0.5)
Eosinophils Relative: 6 %
HCT: 33.4 % — ABNORMAL LOW (ref 36.0–46.0)
Hemoglobin: 10.6 g/dL — ABNORMAL LOW (ref 12.0–15.0)
Immature Granulocytes: 0 %
Lymphocytes Relative: 38 %
Lymphs Abs: 2.1 10*3/uL (ref 0.7–4.0)
MCH: 28.4 pg (ref 26.0–34.0)
MCHC: 31.7 g/dL (ref 30.0–36.0)
MCV: 89.5 fL (ref 80.0–100.0)
Monocytes Absolute: 0.4 10*3/uL (ref 0.1–1.0)
Monocytes Relative: 7 %
Neutro Abs: 2.6 10*3/uL (ref 1.7–7.7)
Neutrophils Relative %: 48 %
Platelet Count: 270 10*3/uL (ref 150–400)
RBC: 3.73 MIL/uL — ABNORMAL LOW (ref 3.87–5.11)
RDW: 13.2 % (ref 11.5–15.5)
WBC Count: 5.4 10*3/uL (ref 4.0–10.5)
nRBC: 0 % (ref 0.0–0.2)

## 2019-01-04 LAB — CMP (CANCER CENTER ONLY)
ALT: 14 U/L (ref 0–44)
AST: 17 U/L (ref 15–41)
Albumin: 3.9 g/dL (ref 3.5–5.0)
Alkaline Phosphatase: 105 U/L (ref 38–126)
Anion gap: 6 (ref 5–15)
BUN: 18 mg/dL (ref 8–23)
CO2: 28 mmol/L (ref 22–32)
Calcium: 9.4 mg/dL (ref 8.9–10.3)
Chloride: 108 mmol/L (ref 98–111)
Creatinine: 0.86 mg/dL (ref 0.44–1.00)
GFR, Est AFR Am: >60 mL/min (ref >60)
GFR, Estimated: >60 mL/min (ref >60)
Glucose, Bld: 90 mg/dL (ref 70–99)
Potassium: 3.8 mmol/L (ref 3.5–5.1)
Sodium: 142 mmol/L (ref 135–145)
Total Bilirubin: 0.4 mg/dL (ref 0.3–1.2)
Total Protein: 7.2 g/dL (ref 6.5–8.1)

## 2019-01-04 NOTE — Progress Notes (Signed)
[facility]  [phone] [phone]     ID: Kathryn Haley DOB: [birth year]  MR#: [id number]  KDT#:[id number]  Patient Care Team: Kathryn Pepper, MD as PCP - General (Family Medicine) Kathryn Seltzer, MD (Inactive) as Consulting Physician (General Surgery) Kathryn Haley, Kathryn Dad, MD as Consulting Physician (Oncology) Thea Silversmith, MD as Consulting Physician (Radiation Oncology) Mauro Kaufmann, RN as Registered Nurse Kathryn Germany, RN as Registered Nurse Kathryn Shark Johny Blamer, NP as Nurse Practitioner (Hematology and Oncology) OTHER MD: Kathryn Haley M.D., Dr Kathryn Haley  CHIEF COMPLAINT: Estrogen receptor positive lobular breast cancer  CURRENT TREATMENT: Observation   INTERVAL HISTORY: Kathryn Haley is here today for follow up of her estrogen receptor positive lobular breast cancer. She continues on observation, as she was unable to tolerate antiestrogens..  Since her last visit, she underwent bilateral diagnostic mammography with tomography at [facility] on 07/21/2018 showing: breast density category B; no evidence of malignancy in either breast.    REVIEW OF SYSTEMS: Kathryn Haley tells me she had an accident in August.  She says that she pressed the break so violently that it damaged her hamstrings on the right.  She says that muscle has become like a little bit of flab and her orthopedics "cannot believe how bad it is".  Despite that she is able to walk.  She also injured her right knee but that seems to be better.  Aside from that she tells me she fell in the bathroom sometime ago while wearing slippers.  She thinks she probably should not live by herself and is beginning to consider moving and with one of her daughters, who however live out of state.  She is keeping appropriate pandemic precautions.  A detailed review of systems today was otherwise stable.   BREAST CANCER HISTORY: From the original intake note:  The patient had routine breast  cancer screening at [facility] 02/24/2014. This showed the breast density to be category B. Some irregularities in the left breast appeared suspicious and the patient was recalled for unilateral left diagnostic [facility] March 11 2014. This showed scar tissue in the upper outer left breast corresponding to an area of surgical scar. (The patient tells me she had 3 cysts removed remotely from her left breast, but were benign). The patient has a history of prior benign excisional left breast biopsy. There were also 3 groups of calcifications in a linear fashion spanning an area of 4.9 cm.  Biopsy of the left breast upper outer quadrant 04/04/2014 showed ([id number]) invasive lobular carcinoma (E-cadherin negative) grade 2, estrogen receptor 99% positive, progesterone receptor 44% positive, both with strong staining intensity, with an MIB-1 of 16%, and no HER-2 amplification, the signals ratio being 1.06 and the number per cell 1.80.  On 04/13/2014 the patient underwent bilateral breast MRI showing in the left breast at the 3:00 location an area of irregular clumped nodular enhancement measuring 4.5 cm. There was clip artifact at the anterior aspect of this. There were no other areas of concern in the left breast, and no abnormal appearing lymph nodes. The right breast was unremarkable.  Her subsequent history is as detailed below   PAST MEDICAL HISTORY: Past Medical History:  Diagnosis Date  . Anxiety   . Arthritis   . Breast cancer ([place])   . Breast cancer of upper-outer quadrant of left female breast ([place]) 04/11/2014  . Family history of breast cancer   . Heart murmur    echo  2013-mild regurg-aortic,mitral,tricu  . Hypercholesteremia   . Hypertension   . Urinary frequency     PAST SURGICAL HISTORY: Past Surgical History:  Procedure Laterality Date  . BREAST EXCISIONAL BIOPSY    . BREAST LUMPECTOMY     left 2016  . BREAST LUMPECTOMY WITH NEEDLE LOCALIZATION  AND AXILLARY SENTINEL LYMPH NODE BX Left 05/09/2014   Procedure: BRACKETED WIRE LOCALIZED LEFT BREAST LUMPECTOMY WITH LEFT  AXILLARY SENTINEL LYMPH NODE BX;  Surgeon: Kathryn Seltzer, MD;  Location: [facility];  Service: General;  Laterality: Left;  . BREAST SURGERY     lt breast bx x3-negative  . COLONOSCOPY    . EYE SURGERY     both cataracts  . TONSILLECTOMY      FAMILY HISTORY Family History  Problem Relation Age of Onset  . Brain cancer Sister   . Breast cancer Daughter   . Breast cancer Daughter        youngest daughter possibly had genetic testing   patient's father died from a heart attack at age 63. The patient's mother died from heart failure at age 59. The patient had one brother, 2 sisters. One of her sisters died from melanoma metastatic to the brain in 2007 area the patient has 3 daughters, 2 of them were diagnosed with breast cancer around the age of 36. They both live in [place] and both are doing well. She does not know whether they were genetically tested. There is no other history of breast or ovarian cancer in the family to the patient's knowledge.   GYNECOLOGIC HISTORY:  No LMP recorded. Patient is postmenopausal. Menarche age 51, first live birth age 65, [name] P3. She stopped having periods around age 27. She did not take hormone replacement.   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 She is separated and lives alone, with no pets. She has no family in this area other than a nephew, Kathryn Haley, who lives 3 hours away. The patient's children are well in the [place] who lives in [place] and is disabled; Kathryn Haley a [place] who lives in [place] and works for [employer]; and Kathryn Haley, who also lives in [place] and is a homemaker. Kathryn Haley and Kathryn Haley are the 2 daughters who had breast cancer in their early 28s the patient attends a local [place]: In place, and being updated through a lawyer per the patient. In case of an accident she requests we call Kathryn Haley , close friend, at [phone]   HEALTH MAINTENANCE: Social History   Tobacco Use  . Smoking status: Never Smoker  . Smokeless tobacco: Never Used  Substance Use Topics  . Alcohol use: No  . Drug use: No     Colonoscopy: In [place], 2008  PAP: 2014  Bone density: Never  Lipid panel:  Allergies  Allergen Reactions  . Ciprofloxacin Shortness Of Breath  . Decadron [Dexamethasone] Anaphylaxis, Swelling and Rash    Swelling of lips and tongue  . Other Anaphylaxis    Patient had antibiotic shot in a MD office , does not know what name of drug is.  . Penicillins Nausea And Vomiting    VOMITTING  . Keflex [Cephalexin] Rash    SKIN RASH  . Toviaz [Fesoterodine Fumarate Er] Rash and Other (See Comments)    DIZZINESS    Current Outpatient Medications  Medication Sig Dispense Refill  .  amLODipine (NORVASC) 5 MG tablet Take 5 mg by mouth at bedtime.     Marland Kitchen aspirin EC 81 MG tablet Take 81 mg by mouth daily.    . cholecalciferol (VITAMIN D) 1000 UNITS tablet Take 1 tablet (1,000 Units total) by mouth daily. 100 tablet 12  . cyanocobalamin 100 MCG tablet Take 100 mcg by mouth daily.    . ferrous sulfate 325 (65 FE) MG tablet Take 325 mg by mouth daily with breakfast.    . meclizine (ANTIVERT) 12.5 MG tablet Take 12.5 mg by mouth 2 (two) times daily as needed for dizziness.    . mirtazapine (REMERON) 15 MG tablet Take 1 tablet (15 mg total) by mouth at bedtime. 90 tablet 4  . rosuvastatin (CRESTOR) 10 MG tablet Take 10 mg by mouth every evening. Pt takes Brand name of this medication    . triamcinolone cream (KENALOG) 0.1 % Apply 1 application topically 2 (two) times daily.    . verapamil (VERELAN PM) 240 MG 24 hr capsule Take 240 mg by mouth every morning.      No current facility-administered medications for this visit.    OBJECTIVE: Middle-aged  African-American woman in no acute distress Vitals:   01/04/19 1427  BP: 132/74  Pulse: 90  Resp: 18  Temp: 98.2 F (36.8 C)  SpO2: 94%     Body mass index is 22.95 kg/m.    ECOG FS:1 - Symptomatic but completely ambulatory  Sclerae unicteric, EOMs intact Wearing a mask No cervical or supraclavicular adenopathy Lungs no rales or rhonchi Heart regular rate and rhythm Abd soft, nontender, positive bowel sounds MSK no focal spinal tenderness, no upper extremity lymphedema Neuro: nonfocal, well oriented, appropriate affect Breasts: Right breast is benign.  The left breast has undergone lumpectomy, with no evidence of disease recurrence.  Both axillae are benign.   LAB RESULTS:  CMP     Component Value Date/Time   NA 142 01/04/2019 1416   NA 142 03/19/2016 0940   K 3.8 01/04/2019 1416   K 3.8 03/19/2016 0940   CL 108 01/04/2019 1416   CO2 28 01/04/2019 1416   CO2 26 03/19/2016 0940   GLUCOSE 90 01/04/2019 1416   GLUCOSE 99 03/19/2016 0940   BUN 18 01/04/2019 1416   BUN 16.6 03/19/2016 0940   CREATININE 0.86 01/04/2019 1416   CREATININE 0.9 03/19/2016 0940   CALCIUM 9.4 01/04/2019 1416   CALCIUM 9.9 03/19/2016 0940   PROT 7.2 01/04/2019 1416   PROT 7.6 03/19/2016 0940   ALBUMIN 3.9 01/04/2019 1416   ALBUMIN 3.9 03/19/2016 0940   AST 17 01/04/2019 1416   AST 14 03/19/2016 0940   ALT 14 01/04/2019 1416   ALT 13 03/19/2016 0940   ALKPHOS 105 01/04/2019 1416   ALKPHOS 111 03/19/2016 0940   BILITOT 0.4 01/04/2019 1416   BILITOT 0.40 03/19/2016 0940   GFRNONAA >60 01/04/2019 1416   GFRAA >60 01/04/2019 1416    INo results found for: SPEP, UPEP  Lab Results  Component Value Date   WBC 5.4 01/04/2019   NEUTROABS 2.6 01/04/2019   HGB 10.6 (L) 01/04/2019   HCT 33.4 (L) 01/04/2019   MCV 89.5 01/04/2019   PLT 270 01/04/2019      Chemistry      Component Value Date/Time   NA 142 01/04/2019 1416   NA 142 03/19/2016 0940   K 3.8 01/04/2019 1416   K 3.8  03/19/2016 0940   CL 108 01/04/2019 1416   CO2 28  01/04/2019 1416   CO2 26 03/19/2016 0940   BUN 18 01/04/2019 1416   BUN 16.6 03/19/2016 0940   CREATININE 0.86 01/04/2019 1416   CREATININE 0.9 03/19/2016 0940      Component Value Date/Time   CALCIUM 9.4 01/04/2019 1416   CALCIUM 9.9 03/19/2016 0940   ALKPHOS 105 01/04/2019 1416   ALKPHOS 111 03/19/2016 0940   AST 17 01/04/2019 1416   AST 14 03/19/2016 0940   ALT 14 01/04/2019 1416   ALT 13 03/19/2016 0940   BILITOT 0.4 01/04/2019 1416   BILITOT 0.40 03/19/2016 0940       No results found for: LABCA2  No components found for: LABCA125  No results for input(s): INR in the last 168 hours.  Urinalysis    Component Value Date/Time   COLORURINE YELLOW 09/22/2016 1522   APPEARANCEUR CLEAR 09/22/2016 1522   LABSPEC 1.016 09/22/2016 1522   PHURINE 6.0 09/22/2016 1522   GLUCOSEU NEGATIVE 09/22/2016 1522   HGBUR NEGATIVE 09/22/2016 1522   BILIRUBINUR NEGATIVE 09/22/2016 1522   KETONESUR NEGATIVE 09/22/2016 1522   PROTEINUR 100 (A) 09/22/2016 1522   UROBILINOGEN 0.2 05/27/2011 1706   NITRITE NEGATIVE 09/22/2016 1522   LEUKOCYTESUR TRACE (A) 09/22/2016 1522    STUDIES: No results found.   ASSESSMENT: 78 y.o. [place] woman status post left breast upper outer quadrant biopsy 04/04/2014 for a clinical T2 N0, stage IIA invasive lobular carcinoma, estrogen receptor 99% positive, progesterone receptor 44% positive, with an MIB-1 of 16% and no HER-2 amplification.  (1) status post left lumpectomy and sentinel lymph node sampling 05/09/2014 for a pT1c pN0, stage IA invasive lobular carcinoma, grade 2, with repeat HER-2 again negative. Margins were clear  (2) Oncotype DX score of 8 predicts a risk of recurrence outside the breast of 6% within 10 years if the patient's only systemic therapy is tamoxifen for 5 years. It also predicts no benefit from chemotherapy.  (3) the patient opted against adjuvant radiation  (4)  anastrozole started 06/28/2014, stopped 07/15/2014.   (5) tamoxifen begun August 2016, stopped after 2 weeks with significant side effects  (6) updated genetics testing of the patient's daughters suggested  (a) patient "not ready" for genetics testing (again discussed 01/04/2019)  PLAN:  Lanaiya is now 4-1/2 years out from definitive surgery for her breast cancer with no evidence of disease recurrence.  This is very favorable.  She has not been able to tolerate antiestrogens and is followed with observation alone  Today we again discussed genetics testing.  She tells me she is not ready but perhaps in the future sometimes.  She did not want to be scheduled to meet with a genetics counselor.  She is considering moving in with one of her daughters, out-of-state, because she feels it is getting more difficult for her to live by herself.  I think that would be a good move on her part and I encouraged her to discuss that with her family  Otherwise she will have mammography again in June and I will see her one last time July 2021.  That should be her "graduation" visit.  She knows to call for any other issue that may develop before the return visit here. Domenique Quest, Kathryn Dad, MD  01/04/19 3:57 PM Medical Oncology and Hematology [facility] [place], [place] [address] Tel. [phone]    Fax. [phone]    I, Wilburn Mylar, am acting as scribe for Dr. Virgie Haley. Braxtyn Dorff.   Lindie Spruce MD,  have reviewed the above documentation for accuracy and completeness, and I agree with the above.

## 2019-01-05 ENCOUNTER — Telehealth: Payer: Self-pay | Admitting: Oncology

## 2019-01-05 NOTE — Telephone Encounter (Signed)
I talk with patient regarding schedule  

## 2019-03-19 ENCOUNTER — Ambulatory Visit: Payer: Medicare HMO | Attending: Internal Medicine

## 2019-03-19 DIAGNOSIS — Z23 Encounter for immunization: Secondary | ICD-10-CM | POA: Insufficient documentation

## 2019-03-19 NOTE — Progress Notes (Signed)
   Covid-19 Vaccination Clinic  Name:  Kathryn Haley    MRN: [id number] DOB: [birth year]  03/19/2019  Kathryn Haley was observed post Covid-19 immunization for 15 minutes without incidence. She was provided with Vaccine Information Sheet and instruction to access the V-Safe system.   Kathryn Haley was instructed to call 911 with any severe reactions post vaccine: Marland Kitchen Difficulty breathing  . Swelling of your face and throat  . A fast heartbeat  . A bad rash all over your body  . Dizziness and weakness    Immunizations Administered    Name Date Dose VIS Date Route   Pfizer COVID-19 Vaccine 03/19/2019 10:43 AM 0.3 mL 01/01/2019 Intramuscular   Manufacturer: [place]   Lot: [id number]   [place]: [id number]

## 2019-04-13 ENCOUNTER — Ambulatory Visit: Payer: Medicare HMO | Attending: Internal Medicine

## 2019-04-13 DIAGNOSIS — Z23 Encounter for immunization: Secondary | ICD-10-CM

## 2019-04-13 NOTE — Progress Notes (Signed)
   Covid-19 Vaccination Clinic  Name:  Kathryn Haley    MRN: [id number] DOB: [birth year]  04/13/2019  [name] was observed post Covid-19 immunization for 30 minutes based on pre-vaccination screening without incident. She was provided with Vaccine Information Sheet and instruction to access the V-Safe system.   [name] was instructed to call 911 with any severe reactions post vaccine: Marland Kitchen Difficulty breathing  . Swelling of face and throat  . A fast heartbeat  . A bad rash all over body  . Dizziness and weakness   Immunizations Administered    Name Date Dose VIS Date Route   Pfizer COVID-19 Vaccine 04/13/2019 12:54 PM 0.3 mL 01/01/2019 Intramuscular   Manufacturer: [place]   Lot: [id number]   [place]: [id number]

## 2019-07-08 ENCOUNTER — Other Ambulatory Visit: Payer: Self-pay | Admitting: Oncology

## 2019-07-08 DIAGNOSIS — Z853 Personal history of malignant neoplasm of breast: Secondary | ICD-10-CM

## 2019-07-08 DIAGNOSIS — Z9889 Other specified postprocedural states: Secondary | ICD-10-CM

## 2019-07-23 ENCOUNTER — Other Ambulatory Visit: Payer: Self-pay

## 2019-07-23 ENCOUNTER — Ambulatory Visit
Admission: RE | Admit: 2019-07-23 | Discharge: 2019-07-23 | Disposition: A | Discharge status: Home / Self Care | Payer: Medicare HMO | Source: Ambulatory Visit | Attending: Oncology | Admitting: Oncology

## 2019-07-23 ENCOUNTER — Other Ambulatory Visit: Payer: Self-pay | Admitting: Oncology

## 2019-07-23 DIAGNOSIS — Z853 Personal history of malignant neoplasm of breast: Secondary | ICD-10-CM

## 2019-07-23 DIAGNOSIS — Z9889 Other specified postprocedural states: Secondary | ICD-10-CM

## 2019-07-23 DIAGNOSIS — R921 Mammographic calcification found on diagnostic imaging of breast: Secondary | ICD-10-CM

## 2019-08-05 ENCOUNTER — Ambulatory Visit: Payer: Medicare HMO | Admitting: Oncology

## 2019-08-05 ENCOUNTER — Other Ambulatory Visit: Payer: Medicare HMO

## 2019-08-16 ENCOUNTER — Other Ambulatory Visit: Payer: Self-pay

## 2019-08-16 ENCOUNTER — Ambulatory Visit
Admission: RE | Admit: 2019-08-16 | Discharge: 2019-08-16 | Disposition: A | Discharge status: Home / Self Care | Payer: Medicare HMO | Source: Ambulatory Visit | Attending: Oncology | Admitting: Oncology

## 2019-08-16 DIAGNOSIS — Z9889 Other specified postprocedural states: Secondary | ICD-10-CM

## 2019-08-16 DIAGNOSIS — Z853 Personal history of malignant neoplasm of breast: Secondary | ICD-10-CM

## 2019-08-16 DIAGNOSIS — R921 Mammographic calcification found on diagnostic imaging of breast: Secondary | ICD-10-CM

## 2020-09-26 ENCOUNTER — Other Ambulatory Visit: Payer: Self-pay | Admitting: Family Medicine

## 2020-09-26 DIAGNOSIS — R599 Enlarged lymph nodes, unspecified: Secondary | ICD-10-CM

## 2020-09-26 DIAGNOSIS — N63 Unspecified lump in unspecified breast: Secondary | ICD-10-CM

## 2020-10-06 ENCOUNTER — Other Ambulatory Visit: Payer: Self-pay | Admitting: Oncology

## 2020-10-06 DIAGNOSIS — R599 Enlarged lymph nodes, unspecified: Secondary | ICD-10-CM

## 2020-10-10 ENCOUNTER — Ambulatory Visit
Admission: RE | Admit: 2020-10-10 | Discharge: 2020-10-10 | Disposition: A | Discharge status: Home / Self Care | Payer: Medicare HMO | Source: Ambulatory Visit | Attending: Family Medicine | Admitting: Family Medicine

## 2020-10-10 ENCOUNTER — Other Ambulatory Visit: Payer: Self-pay

## 2020-10-10 DIAGNOSIS — R599 Enlarged lymph nodes, unspecified: Secondary | ICD-10-CM

## 2021-02-27 ENCOUNTER — Encounter (HOSPITAL_BASED_OUTPATIENT_CLINIC_OR_DEPARTMENT_OTHER): Payer: Self-pay

## 2021-02-27 ENCOUNTER — Other Ambulatory Visit: Payer: Self-pay

## 2021-02-27 ENCOUNTER — Emergency Department (HOSPITAL_BASED_OUTPATIENT_CLINIC_OR_DEPARTMENT_OTHER)
Admission: EM | Admit: 2021-02-27 | Discharge: 2021-02-27 | Disposition: A | Discharge status: Home / Self Care | Payer: Medicare Other | Attending: Emergency Medicine | Admitting: Emergency Medicine

## 2021-02-27 DIAGNOSIS — I10 Essential (primary) hypertension: Secondary | ICD-10-CM | POA: Diagnosis not present

## 2021-02-27 DIAGNOSIS — R253 Fasciculation: Secondary | ICD-10-CM | POA: Insufficient documentation

## 2021-02-27 DIAGNOSIS — Z79899 Other long term (current) drug therapy: Secondary | ICD-10-CM | POA: Insufficient documentation

## 2021-02-27 DIAGNOSIS — Z7982 Long term (current) use of aspirin: Secondary | ICD-10-CM | POA: Diagnosis not present

## 2021-02-27 DIAGNOSIS — G514 Facial myokymia: Secondary | ICD-10-CM

## 2021-02-27 NOTE — ED Provider Notes (Signed)
[facility] EMERGENCY DEPT Provider Note   CSN: [id number] Arrival date & time: 02/27/21  1710     History  Chief Complaint  Patient presents with   Facial Tightness    Kathryn Haley is a 81 y.o. female.  81 year old female who presents with intermittent left-sided facial twitching.  States that last for only seconds.  Not associate with other neurological findings such as weakness in her arms or legs or confusion or headache.  Patient states that she been under great deal of stress recently.  Notes that she has been having symptoms for quite some time.  No treatment use prior to arrival nothing seems to make them better or worse      Home Medications Prior to Admission medications   Medication Sig Start Date End Date Taking? Authorizing Provider  amLODipine (NORVASC) 5 MG tablet Take 5 mg by mouth at bedtime.     [provider]  aspirin EC 81 MG tablet Take 81 mg by mouth daily.    [provider]  cholecalciferol (VITAMIN D) 1000 UNITS tablet Take 1 tablet (1,000 Units total) by mouth daily. 06/08/14   Magrinat, Virgie Dad, MD  cyanocobalamin 100 MCG tablet Take 100 mcg by mouth daily.    [provider]  ferrous sulfate 325 (65 FE) MG tablet Take 325 mg by mouth daily with breakfast.    [provider]  meclizine (ANTIVERT) 12.5 MG tablet Take 12.5 mg by mouth 2 (two) times daily as needed for dizziness.    [provider]  mirtazapine (REMERON) 15 MG tablet Take 1 tablet (15 mg total) by mouth at bedtime. 11/20/17   Magrinat, Virgie Dad, MD  rosuvastatin (CRESTOR) 10 MG tablet Take 10 mg by mouth every evening. Pt takes Brand name of this medication    [provider]  triamcinolone cream (KENALOG) 0.1 % Apply 1 application topically 2 (two) times daily.    [provider]  verapamil (VERELAN PM) 240 MG 24 hr capsule Take 240 mg by mouth every morning.     [provider]      Allergies     Ciprofloxacin, Decadron [dexamethasone], Other, Penicillins, Sulfa antibiotics, Keflex [cephalexin], and Toviaz [fesoterodine fumarate er]    Review of Systems   Review of Systems  All other systems reviewed and are negative.  Physical Exam Updated Vital Signs BP (!) 157/77    Pulse (!) 56    Temp 99.6 F (37.6 C)    Resp 18    Ht 1.651 m (5\' 5" )    Wt 62.6 kg    SpO2 100%    BMI 22.97 kg/m  Physical Exam Vitals and nursing note reviewed.  Constitutional:      General: She is not in acute distress.    Appearance: Normal appearance. She is well-developed. She is not toxic-appearing.  HENT:     Head: Normocephalic and atraumatic.  Eyes:     General: Lids are normal.     Conjunctiva/sclera: Conjunctivae normal.     Pupils: Pupils are equal, round, and reactive to light.  Neck:     Thyroid: No thyroid mass.     Trachea: No tracheal deviation.  Cardiovascular:     Rate and Rhythm: Normal rate and regular rhythm.     Heart sounds: Normal heart sounds. No murmur heard.   No gallop.  Pulmonary:     Effort: Pulmonary effort is normal. No respiratory distress.     Breath sounds: Normal breath sounds.  No stridor. No decreased breath sounds, wheezing, rhonchi or rales.  Abdominal:     General: There is no distension.     Palpations: Abdomen is soft.     Tenderness: There is no abdominal tenderness. There is no rebound.  Musculoskeletal:        General: No tenderness. Normal range of motion.     Cervical back: Normal range of motion and neck supple.  Skin:    General: Skin is warm and dry.     Findings: No abrasion or rash.  Neurological:     General: No focal deficit present.     Mental Status: She is alert and oriented to person, place, and time. Mental status is at baseline.     GCS: GCS eye subscore is 4. GCS verbal subscore is 5. GCS motor subscore is 6.     Cranial Nerves: No cranial nerve deficit.     Sensory: No sensory deficit.     Motor: Motor function is intact.   Psychiatric:        Attention and Perception: Attention normal.        Speech: Speech normal.        Behavior: Behavior normal.    ED Results / Procedures / Treatments   Labs (all labs ordered are listed, but only abnormal results are displayed) Labs Reviewed - No data to display  EKG None  Radiology No results found.  Procedures Procedures    Medications Ordered in ED Medications - No data to display  ED Course/ Medical Decision Making/ A&P                           Medical Decision Making Old records reviewed. Patient has normal neurological exam at this time.  She has not had any recent illnesses.  Patient description of intermittent left-sided facial tightness and twitching lasts only seconds is unlikely to be caused by TIAs.  Does not appear to have any facial nerve issues based on history.  Do not feel that she needs imaging at this time or laboratory studies.  Feel that this could be stress related and patient encouraged to follow-up with her doctor return precautions given        Final Clinical Impression(s) / ED Diagnoses Final diagnoses:  None    Rx / DC Orders ED Discharge Orders     None         Lacretia Leigh, MD 02/27/21 1955

## 2021-02-27 NOTE — Discharge Instructions (Signed)
Followup with your doctor as needed

## 2021-02-27 NOTE — ED Triage Notes (Signed)
Patient here POV from Home with Facial Tightness.  Patient states this occurs bilaterally but mostly on the Left Side. No Pain. No Facial Droop noted.  Patient states it happens intermittently and First Occurrence was approximately 1 month PTA.  NAD Noted during Triage. A&Ox4. GCS 15. BIB Wheelchair.

## 2021-03-08 ENCOUNTER — Telehealth: Payer: Self-pay | Admitting: *Deleted

## 2021-03-08 NOTE — Telephone Encounter (Signed)
This RN spoke with pt per her call stating " I need someone to check my breast ".  Kathryn Haley states she had her mammogram in Sept with normal reading but now " my left breast does not feel right and it is shrinking "  She [facility] the [facility] per concern but was told she could not get another mammogram until September 2023.  Per review pt has history of lobular breast cancer - and did not tolerate use of antiestrogens.  She was to be seen in the summer of 2021 - pt canceled appt at that time and did not reschedule.  Per discussion appt made for evaluation by our NP- appt made with pt during the call.

## 2021-03-12 ENCOUNTER — Inpatient Hospital Stay: Payer: Medicare Other | Attending: Adult Health | Admitting: Adult Health

## 2021-03-12 ENCOUNTER — Other Ambulatory Visit: Payer: Self-pay

## 2021-03-12 ENCOUNTER — Encounter: Payer: Self-pay | Admitting: Adult Health

## 2021-03-12 VITALS — BP 161/69 | HR 84 | Temp 98.4°F | Resp 16 | Ht 65.0 in | Wt 123.0 lb

## 2021-03-12 DIAGNOSIS — Z803 Family history of malignant neoplasm of breast: Secondary | ICD-10-CM | POA: Insufficient documentation

## 2021-03-12 DIAGNOSIS — I10 Essential (primary) hypertension: Secondary | ICD-10-CM | POA: Diagnosis not present

## 2021-03-12 DIAGNOSIS — Z170 Estrogen receptor positive status [ER+]: Secondary | ICD-10-CM | POA: Diagnosis not present

## 2021-03-12 DIAGNOSIS — Z808 Family history of malignant neoplasm of other organs or systems: Secondary | ICD-10-CM | POA: Diagnosis not present

## 2021-03-12 DIAGNOSIS — C50412 Malignant neoplasm of upper-outer quadrant of left female breast: Secondary | ICD-10-CM

## 2021-03-12 NOTE — Progress Notes (Signed)
[place]  Telephone:([phone] Fax:(336) [phone]     ID: Kathryn Haley DOB: 1940-12-23  MR#: [id number]  YSA#:[id number]  Patient Care Team: London Pepper, MD as PCP - General (Family Medicine) Excell Seltzer, MD (Inactive) as Consulting Physician (General Surgery) Magrinat, Virgie Dad, MD (Inactive) as Consulting Physician (Oncology) Thea Silversmith, MD as Consulting Physician (Radiation Oncology) Mauro Kaufmann, RN as Registered Nurse Rockwell Germany, RN as Registered Nurse Jake Shark Johny Blamer, NP as Nurse Practitioner (Hematology and Oncology) OTHER MD: Nicki Reaper McDiarmid M.D., Dr London Pepper  CHIEF COMPLAINT: Estrogen receptor positive lobular breast cancer  CURRENT TREATMENT: Observation   INTERVAL HISTORY: Kathryn Haley is here today for follow up of her estrogen receptor positive lobular breast cancer. She continues on observation, as she was unable to tolerate antiestrogens.Kathryn Haley is here today for urgent evaluation of left breast changes.  She notes that since May she has lost weight and her left breast has been shrinking.  Her right breast has not been shrinking.  She is concerned about the difference.  She notes that her most recent mammogram was completed in September 2022 and was normal.  She had breast density category C at that time.  REVIEW OF SYSTEMS: Review of Systems  Constitutional:  Negative for appetite change, chills, fatigue, fever and unexpected weight change.  HENT:   Negative for hearing loss, lump/mass and trouble swallowing.   Eyes:  Negative for eye problems and icterus.  Respiratory:  Negative for chest tightness, cough and shortness of breath.   Cardiovascular:  Negative for chest pain, leg swelling and palpitations.  Gastrointestinal:  Negative for abdominal distention, abdominal pain, constipation, diarrhea, nausea and vomiting.  Endocrine: Negative for hot flashes.  Genitourinary:  Negative for difficulty urinating.    Musculoskeletal:  Negative for arthralgias.  Skin:  Negative for itching and rash.  Neurological:  Negative for dizziness, extremity weakness, headaches and numbness.  Hematological:  Negative for adenopathy. Does not bruise/bleed easily.  Psychiatric/Behavioral:  Negative for depression. The patient is not nervous/anxious.     BREAST CANCER HISTORY: From the original intake note:  The patient had routine breast cancer screening at [facility] 02/24/2014. This showed the breast density to be category B. Some irregularities in the left breast appeared suspicious and the patient was recalled for unilateral left diagnostic mammography at the [facility] March 11 2014. This showed scar tissue in the upper outer left breast corresponding to an area of surgical scar. (The patient tells me she had 3 cysts removed remotely from her left breast, but were benign). The patient has a history of prior benign excisional left breast biopsy. There were also 3 groups of calcifications in a linear fashion spanning an area of 4.9 cm.  Biopsy of the left breast upper outer quadrant 04/04/2014 showed ([id number]) invasive lobular carcinoma (E-cadherin negative) grade 2, estrogen receptor 99% positive, progesterone receptor 44% positive, both with strong staining intensity, with an MIB-1 of 16%, and no HER-2 amplification, the signals ratio being 1.06 and the number per cell 1.80.  On 04/13/2014 the patient underwent bilateral breast MRI showing in the left breast at the 3:00 location an area of irregular clumped nodular enhancement measuring 4.5 cm. There was clip artifact at the anterior aspect of this. There were no other areas of concern in the left breast, and no abnormal appearing lymph nodes. The right breast was unremarkable.  Her subsequent history is as detailed below   PAST MEDICAL HISTORY: Past Medical History:  Diagnosis Date   Anxiety    Arthritis    Breast cancer ([place])    Breast cancer  of upper-outer quadrant of left female breast ([place]) 04/11/2014   Family history of breast cancer    Heart murmur    echo 2013-mild regurg-aortic,mitral,tricu   Hypercholesteremia    Hypertension    Urinary frequency     PAST SURGICAL HISTORY: Past Surgical History:  Procedure Laterality Date   BREAST EXCISIONAL BIOPSY     BREAST LUMPECTOMY     left 2016   BREAST LUMPECTOMY WITH NEEDLE LOCALIZATION AND AXILLARY SENTINEL LYMPH NODE BX Left 05/09/2014   Procedure: BRACKETED WIRE LOCALIZED LEFT BREAST LUMPECTOMY WITH LEFT  AXILLARY SENTINEL LYMPH NODE BX;  Surgeon: Excell Seltzer, MD;  Location: [place];  Service: General;  Laterality: Left;   BREAST SURGERY     lt breast bx x3-negative   COLONOSCOPY     EYE SURGERY     both cataracts   TONSILLECTOMY      FAMILY HISTORY Family History  Problem Relation Age of Onset   Brain cancer Sister    Breast cancer Daughter    Breast cancer Daughter        youngest daughter possibly had genetic testing   patient's father died from a heart attack at age 11. The patient's mother died from heart failure at age 46. The patient had one brother, 2 sisters. One of her sisters died from melanoma metastatic to the brain in 2007 area the patient has 3 daughters, 2 of them were diagnosed with breast cancer around the age of 19. They both live in [place] and both are doing well. She does not know whether they were genetically tested. There is no other history of breast or ovarian cancer in the family to the patient's knowledge.   GYNECOLOGIC HISTORY:  No LMP recorded. Patient is postmenopausal. Menarche age 72, first live birth age 7, the patient is [place] P3. She stopped having periods around age 53. She did not take hormone replacement.   SOCIAL HISTORY:  Takeysha used to work for [employer] in Radio [employer]. She is now retired. She is separated and lives alone, with no pets. She has no family in this area other than a nephew,  Alejandro Mulling, who lives 3 hours away. The patient's children are well in the [place] who lives in [place] and is disabled; Iona Beard a [place] who lives in [place] and works for [employer]; and Arnette Schaumann, who also lives in [place] and is a homemaker. [place] and Freda Munro are the 2 daughters who had breast cancer in their early 40s the patient attends a local [place]: In place, and being updated through a lawyer per the patient. In case of an accident she requests we call Elon Spanner , close friend, at [phone]   HEALTH MAINTENANCE: Social History   Tobacco Use   Smoking status: Never   Smokeless tobacco: Never  Substance Use Topics   Alcohol use: No   Drug use: No     Colonoscopy: In [place], 2008  PAP: 2014  Bone density: Never  Lipid panel:  Allergies  Allergen Reactions   Ciprofloxacin Shortness Of Breath   Decadron [Dexamethasone] Anaphylaxis, Swelling and Rash    Swelling of lips and tongue   Other Anaphylaxis    Patient had antibiotic shot in a [place] office , does not know what name of drug is.   Penicillins  Nausea And Vomiting    VOMITTING   Sulfa Antibiotics Swelling   Keflex [Cephalexin] Rash    SKIN RASH   Toviaz [Fesoterodine Fumarate Er] Rash and Other (See Comments)    DIZZINESS    Current Outpatient Medications  Medication Sig Dispense Refill   amLODipine (NORVASC) 5 MG tablet Take 5 mg by mouth at bedtime.      aspirin EC 81 MG tablet Take 81 mg by mouth daily.     cholecalciferol (VITAMIN D) 1000 UNITS tablet Take 1 tablet (1,000 Units total) by mouth daily. 100 tablet 12   cyanocobalamin 100 MCG tablet Take 100 mcg by mouth daily.     ferrous sulfate 325 (65 FE) MG tablet Take 325 mg by mouth daily with breakfast.     meclizine (ANTIVERT) 12.5 MG tablet Take 12.5 mg by mouth 2 (two) times daily as needed for dizziness.     mirtazapine (REMERON) 15 MG tablet Take 1 tablet (15 mg total) by mouth at bedtime.  90 tablet 4   rosuvastatin (CRESTOR) 10 MG tablet Take 10 mg by mouth every evening. Pt takes Brand name of this medication     triamcinolone cream (KENALOG) 0.1 % Apply 1 application topically 2 (two) times daily.     verapamil (VERELAN PM) 240 MG 24 hr capsule Take 240 mg by mouth every morning.      No current facility-administered medications for this visit.    OBJECTIVE: Vitals:   03/12/21 1315  BP: (!) 161/69  Pulse: 84  Resp: 16  Temp: 98.4 F (36.9 C)  SpO2: 100%     Body mass index is 20.47 kg/m.    ECOG FS:1 - Symptomatic but completely ambulatory GENERAL: Patient is a well appearing female in no acute distress HEENT:  Sclerae anicteric.  Oropharynx clear and moist. No ulcerations or evidence of oropharyngeal candidiasis. Neck is supple.  NODES:  No cervical, supraclavicular, or axillary lymphadenopathy palpated.  BREAST EXAM: Right breast is benign left breast is status postlumpectomy and radiation that is smaller than the left.  There is thickness in the left upper outer quadrant difficult to tell if these are radiation changes or if this is a difference for the patient. LUNGS:  Clear to auscultation bilaterally.  No wheezes or rhonchi. HEART:  Regular rate and rhythm. No murmur appreciated. ABDOMEN:  Soft, nontender.  Positive, normoactive bowel sounds. No organomegaly palpated. MSK:  No focal spinal tenderness to palpation. Full range of motion bilaterally in the upper extremities. EXTREMITIES:  No peripheral edema.   SKIN:  Clear with no obvious rashes or skin changes. No nail dyscrasia. NEURO:  Nonfocal. Well oriented.  Appropriate affect.    LAB RESULTS:  CMP     Component Value Date/Time   NA 142 01/04/2019 1416   NA 142 03/19/2016 0940   K 3.8 01/04/2019 1416   K 3.8 03/19/2016 0940   CL 108 01/04/2019 1416   CO2 28 01/04/2019 1416   CO2 26 03/19/2016 0940   GLUCOSE 90 01/04/2019 1416   GLUCOSE 99 03/19/2016 0940   BUN 18 01/04/2019 1416   BUN 16.6  03/19/2016 0940   CREATININE 0.86 01/04/2019 1416   CREATININE 0.9 03/19/2016 0940   CALCIUM 9.4 01/04/2019 1416   CALCIUM 9.9 03/19/2016 0940   PROT 7.2 01/04/2019 1416   PROT 7.6 03/19/2016 0940   ALBUMIN 3.9 01/04/2019 1416   ALBUMIN 3.9 03/19/2016 0940   AST 17 01/04/2019 1416   AST 14 03/19/2016 0940  ALT 14 01/04/2019 1416   ALT 13 03/19/2016 0940   ALKPHOS 105 01/04/2019 1416   ALKPHOS 111 03/19/2016 0940   BILITOT 0.4 01/04/2019 1416   BILITOT 0.40 03/19/2016 0940   GFRNONAA >60 01/04/2019 1416   GFRAA >60 01/04/2019 1416    INo results found for: SPEP, UPEP  Lab Results  Component Value Date   WBC 5.4 01/04/2019   NEUTROABS 2.6 01/04/2019   HGB 10.6 (L) 01/04/2019   HCT 33.4 (L) 01/04/2019   MCV 89.5 01/04/2019   PLT 270 01/04/2019      Chemistry      Component Value Date/Time   NA 142 01/04/2019 1416   NA 142 03/19/2016 0940   K 3.8 01/04/2019 1416   K 3.8 03/19/2016 0940   CL 108 01/04/2019 1416   CO2 28 01/04/2019 1416   CO2 26 03/19/2016 0940   BUN 18 01/04/2019 1416   BUN 16.6 03/19/2016 0940   CREATININE 0.86 01/04/2019 1416   CREATININE 0.9 03/19/2016 0940      Component Value Date/Time   CALCIUM 9.4 01/04/2019 1416   CALCIUM 9.9 03/19/2016 0940   ALKPHOS 105 01/04/2019 1416   ALKPHOS 111 03/19/2016 0940   AST 17 01/04/2019 1416   AST 14 03/19/2016 0940   ALT 14 01/04/2019 1416   ALT 13 03/19/2016 0940   BILITOT 0.4 01/04/2019 1416   BILITOT 0.40 03/19/2016 0940       No results found for: LABCA2  No components found for: LABCA125  No results for input(s): INR in the last 168 hours.  Urinalysis    Component Value Date/Time   COLORURINE YELLOW 09/22/2016 1522   APPEARANCEUR CLEAR 09/22/2016 1522   LABSPEC 1.016 09/22/2016 1522   PHURINE 6.0 09/22/2016 1522   GLUCOSEU NEGATIVE 09/22/2016 1522   HGBUR NEGATIVE 09/22/2016 1522   BILIRUBINUR NEGATIVE 09/22/2016 1522   KETONESUR NEGATIVE 09/22/2016 1522   PROTEINUR 100 (A)  09/22/2016 1522   UROBILINOGEN 0.2 05/27/2011 1706   NITRITE NEGATIVE 09/22/2016 1522   LEUKOCYTESUR TRACE (A) 09/22/2016 1522    STUDIES: No results found.   ASSESSMENT: 81 y.o. [place] woman status post left breast upper outer quadrant biopsy 04/04/2014 for a clinical T2 N0, stage IIA invasive lobular carcinoma, estrogen receptor 99% positive, progesterone receptor 44% positive, with an MIB-1 of 16% and no HER-2 amplification.  (1) status post left lumpectomy and sentinel lymph node sampling 05/09/2014 for a pT1c pN0, stage IA invasive lobular carcinoma, grade 2, with repeat HER-2 again negative. Margins were clear  (2) Oncotype DX score of 8 predicts a risk of recurrence outside the breast of 6% within 10 years if the patient's only systemic therapy is tamoxifen for 5 years. It also predicts no benefit from chemotherapy.  (3) the patient opted against adjuvant radiation  (4) anastrozole started 06/28/2014, stopped 07/15/2014.   (5) tamoxifen begun August 2016, stopped after 2 weeks with significant side effects  (6) updated genetics testing of the patient's daughters suggested  (a) patient "not ready" for genetics testing (again discussed 01/04/2019)  PLAN:  Kathryn Haley is here today for urgent evaluation of left breast asymmetry.  I do not feel any definite nodule on her breast exam however due to the asymmetry and the fact that she had invasive lobular cancer which can present differently than a breast nodule I have placed orders for her to undergo diagnostic mammogram and ultrasound at the breast center.  Kathryn Haley had graduated from follow-up with [place] here at the cancer center  however I let her know if she ever needs to see [place] again in the future that we are always happy to do so.  Total encounter time: 20 minutes in face-to-face visit time, chart review, lab review, care coordination, order entry, and documentation of the encounter.  Wilber Bihari, NP 03/12/21 1:37 PM Medical Oncology  and Hematology [facility] [place], [place] [address] Tel. [phone]    Fax. [phone]

## 2021-03-13 ENCOUNTER — Ambulatory Visit: Admission: RE | Admit: 2021-03-13 | Payer: Medicare Other | Source: Ambulatory Visit

## 2021-03-13 ENCOUNTER — Ambulatory Visit
Admission: RE | Admit: 2021-03-13 | Discharge: 2021-03-13 | Disposition: A | Discharge status: Home / Self Care | Payer: Medicare Other | Source: Ambulatory Visit | Attending: Adult Health | Admitting: Adult Health

## 2021-03-13 DIAGNOSIS — C50412 Malignant neoplasm of upper-outer quadrant of left female breast: Secondary | ICD-10-CM

## 2021-03-13 DIAGNOSIS — Z170 Estrogen receptor positive status [ER+]: Secondary | ICD-10-CM

## 2021-04-10 ENCOUNTER — Ambulatory Visit: Payer: Medicare Other | Admitting: Neurology

## 2021-06-05 ENCOUNTER — Emergency Department (HOSPITAL_BASED_OUTPATIENT_CLINIC_OR_DEPARTMENT_OTHER)
Admission: EM | Admit: 2021-06-05 | Discharge: 2021-06-05 | Disposition: A | Discharge status: Home / Self Care | Payer: Medicare Other | Attending: Emergency Medicine | Admitting: Emergency Medicine

## 2021-06-05 ENCOUNTER — Other Ambulatory Visit: Payer: Self-pay

## 2021-06-05 ENCOUNTER — Encounter (HOSPITAL_BASED_OUTPATIENT_CLINIC_OR_DEPARTMENT_OTHER): Payer: Self-pay | Admitting: Obstetrics and Gynecology

## 2021-06-05 DIAGNOSIS — R112 Nausea with vomiting, unspecified: Secondary | ICD-10-CM | POA: Diagnosis not present

## 2021-06-05 DIAGNOSIS — R531 Weakness: Secondary | ICD-10-CM | POA: Insufficient documentation

## 2021-06-05 DIAGNOSIS — I10 Essential (primary) hypertension: Secondary | ICD-10-CM | POA: Insufficient documentation

## 2021-06-05 DIAGNOSIS — Z7982 Long term (current) use of aspirin: Secondary | ICD-10-CM | POA: Diagnosis not present

## 2021-06-05 DIAGNOSIS — Z79899 Other long term (current) drug therapy: Secondary | ICD-10-CM | POA: Diagnosis not present

## 2021-06-05 DIAGNOSIS — R7989 Other specified abnormal findings of blood chemistry: Secondary | ICD-10-CM | POA: Insufficient documentation

## 2021-06-05 LAB — CBC WITH DIFFERENTIAL/PLATELET
Abs Immature Granulocytes: 0.01 10*3/uL (ref 0.00–0.07)
Basophils Absolute: 0 10*3/uL (ref 0.0–0.1)
Basophils Relative: 0 %
Eosinophils Absolute: 0.2 10*3/uL (ref 0.0–0.5)
Eosinophils Relative: 2 %
HCT: 33 % — ABNORMAL LOW (ref 36.0–46.0)
Hemoglobin: 10.6 g/dL — ABNORMAL LOW (ref 12.0–15.0)
Immature Granulocytes: 0 %
Lymphocytes Relative: 18 %
Lymphs Abs: 1.2 10*3/uL (ref 0.7–4.0)
MCH: 28.3 pg (ref 26.0–34.0)
MCHC: 32.1 g/dL (ref 30.0–36.0)
MCV: 88.2 fL (ref 80.0–100.0)
Monocytes Absolute: 0.4 10*3/uL (ref 0.1–1.0)
Monocytes Relative: 7 %
Neutro Abs: 4.9 10*3/uL (ref 1.7–7.7)
Neutrophils Relative %: 73 %
Platelets: 266 10*3/uL (ref 150–400)
RBC: 3.74 MIL/uL — ABNORMAL LOW (ref 3.87–5.11)
RDW: 13.2 % (ref 11.5–15.5)
WBC: 6.7 10*3/uL (ref 4.0–10.5)
nRBC: 0 % (ref 0.0–0.2)

## 2021-06-05 LAB — BASIC METABOLIC PANEL
Anion gap: 9 (ref 5–15)
BUN: 16 mg/dL (ref 8–23)
CO2: 28 mmol/L (ref 22–32)
Calcium: 9.5 mg/dL (ref 8.9–10.3)
Chloride: 104 mmol/L (ref 98–111)
Creatinine, Ser: 1.31 mg/dL — ABNORMAL HIGH (ref 0.44–1.00)
GFR, Estimated: 41 mL/min — ABNORMAL LOW (ref >60)
Glucose, Bld: 132 mg/dL — ABNORMAL HIGH (ref 70–99)
Potassium: 3.6 mmol/L (ref 3.5–5.1)
Sodium: 141 mmol/L (ref 135–145)

## 2021-06-05 MED ORDER — SODIUM CHLORIDE 0.9 % IV BOLUS
500.0000 mL | Freq: Once | INTRAVENOUS | Status: AC
  Administered 2021-06-05: 500 mL via INTRAVENOUS

## 2021-06-05 NOTE — ED Notes (Signed)
Pt verbalizes understanding of discharge instructions. Opportunity for questioning and answers were provided. Pt discharged from ED to home.   ? ?

## 2021-06-05 NOTE — Discharge Instructions (Signed)
You have been seen and discharged from the emergency department.  Your blood work showed mild dehydration, you received IV fluids.  EKG of the heart was unremarkable.  Follow-up with your primary provider for further evaluation and further care. Take home medications as prescribed. If you have any worsening symptoms or further concerns for your health please return to an emergency department for further evaluation. ?

## 2021-06-05 NOTE — ED Notes (Signed)
Pt's daughter from [place] updated with pt's permission. ?

## 2021-06-05 NOTE — ED Provider Notes (Signed)
?[facility] EMERGENCY DEPT ?Provider Note ? ? ?CSN: [id number] ?Arrival date & time: 06/05/21  Bosie Helper ? ?  ? ?History ? ?Chief Complaint  ?Patient presents with  ? Weakness  ? Nausea  ? Emesis  ? ? ?Kathryn Haley is a 81 y.o. female. ? ?HPI ? ? 81 year old female with past medical history of HTN, HLD presents with episode of weakness and one episode of vomiting. Patient states she was at [employer] after eating [employer]. She states she ate the food very quickly before walking into the store. While in one of the aisles she had nausea, episode of vomiting followed by generalized weakness that lasted a few minutes. No associated chest pain, palpations, lightheadedness, shortness of breath, abdominal pain, diarrhea. No recent fever or illness. There was no syncope. Nausea resolved after vomiting. She feels back to baseline. No headache, neck pain or focal neurologic complaint.  ? ?Home Medications ?Prior to Admission medications   ?Medication Sig Start Date End Date Taking? Authorizing Provider  ?amLODipine (NORVASC) 5 MG tablet Take 5 mg by mouth at bedtime.     [provider]  ?aspirin EC 81 MG tablet Take 81 mg by mouth daily.    [provider]  ?cholecalciferol (VITAMIN D) 1000 UNITS tablet Take 1 tablet (1,000 Units total) by mouth daily. 06/08/14   Magrinat, Virgie Dad, MD  ?cyanocobalamin 100 MCG tablet Take 100 mcg by mouth daily.    [provider]  ?ferrous sulfate 325 (65 FE) MG tablet Take 325 mg by mouth daily with breakfast.    [provider]  ?meclizine (ANTIVERT) 12.5 MG tablet Take 12.5 mg by mouth 2 (two) times daily as needed for dizziness.    [provider]  ?mirtazapine (REMERON) 15 MG tablet Take 1 tablet (15 mg total) by mouth at bedtime. 11/20/17   Magrinat, Virgie Dad, MD  ?rosuvastatin (CRESTOR) 10 MG tablet Take 10 mg by mouth every evening. Pt takes Brand name of this medication    [provider]  ?triamcinolone cream (KENALOG) 0.1 %  Apply 1 application topically 2 (two) times daily.    [provider]  ?verapamil (VERELAN PM) 240 MG 24 hr capsule Take 240 mg by mouth every morning.     [provider]  ?   ? ?Allergies    ?Ciprofloxacin, Decadron [dexamethasone], Other, Penicillins, Sulfa antibiotics, Keflex [cephalexin], and Toviaz [fesoterodine fumarate er]   ? ?Review of Systems   ?Review of Systems  ?Constitutional:  Negative for fever.  ?Eyes:  Negative for visual disturbance.  ?Respiratory:  Negative for shortness of breath.   ?Cardiovascular:  Negative for chest pain, palpitations and leg swelling.  ?Gastrointestinal:  Positive for nausea and vomiting. Negative for abdominal pain.  ?Musculoskeletal:  Negative for back pain and neck pain.  ?Neurological:  Negative for syncope, weakness, light-headedness, numbness and headaches.  ? ?Physical Exam ?Updated Vital Signs ?BP (!) 141/68   Pulse (!) 55   Temp 98.1 ?F (36.7 ?C) (Oral)   Resp (!) 25   Ht '5\' 5"'$  (1.651 m)   Wt 55.8 kg   SpO2 100%   BMI 20.47 kg/m?  ?Physical Exam ?Vitals and nursing note reviewed.  ?Constitutional:   ?   General: She is not in acute distress. ?   Appearance: Normal appearance. She is not diaphoretic.  ?HENT:  ?   Head: Normocephalic.  ?   Mouth/Throat:  ?   Mouth: Mucous membranes are moist.  ?Cardiovascular:  ?  Rate and Rhythm: Normal rate.  ?Pulmonary:  ?   Effort: Pulmonary effort is normal. No respiratory distress.  ?Abdominal:  ?   Palpations: Abdomen is soft.  ?   Tenderness: There is no abdominal tenderness.  ?Musculoskeletal:     ?   General: No swelling.  ?Skin: ?   General: Skin is warm.  ?Neurological:  ?   General: No focal deficit present.  ?   Mental Status: She is alert and oriented to person, place, and time. Mental status is at baseline.  ?Psychiatric:     ?   Mood and Affect: Mood normal.  ? ? ?ED Results / Procedures / Treatments   ?Labs ?(all labs ordered are listed, but only abnormal results are displayed) ?Labs  Reviewed  ?CBC WITH DIFFERENTIAL/PLATELET - Abnormal; Notable for the following components:  ?    Result Value  ? RBC 3.74 (*)   ? Hemoglobin 10.6 (*)   ? HCT 33.0 (*)   ? All other components within normal limits  ?BASIC METABOLIC PANEL - Abnormal; Notable for the following components:  ? Glucose, Bld 132 (*)   ? Creatinine, Ser 1.31 (*)   ? GFR, Estimated 41 (*)   ? All other components within normal limits  ? ? ?EKG ?EKG Interpretation ? ?Date/Time:  Tuesday Jun 05 2021 18:55:57 EDT ?Ventricular Rate:  51 ?PR Interval:  152 ?QRS Duration: 76 ?QT Interval:  447 ?QTC Calculation: 412 ?R Axis:   53 ?Text Interpretation: Sinus rhythm Consider left ventricular hypertrophy Minimal ST elevation, inferior leads Confirmed by Lavenia Atlas [id number]) on 06/05/2021 7:44:46 PM ? ?Radiology ?No results found. ? ?Procedures ?Procedures  ? ? ?Medications Ordered in ED ?Medications  ?sodium chloride 0.9 % bolus 500 mL (0 mLs Intravenous Stopped 06/05/21 2225)  ? ? ?ED Course/ Medical Decision Making/ A&P ?  ?                        ?Medical Decision Making ?Amount and/or Complexity of Data Reviewed ?Labs: ordered. ? ? ?80 year old female presents after episode of nausea, vomiting and weakness. Resolved on arrival. No other associated symptoms. No syncope. Feels back to baseline. VS stable on arrival. She has no active complaints. ? ?No abdominal pain. No GU symptoms. Blood work shows slightly elevated creatinine. IVF given. EKG shows no acute or concerning changes. Low suspicion for ACS. There was documented episode of tachycardia, hypoxia. I evaluated the patient after this and vitals back to normal on my evaluation. Telemetry review appears to be artifact. Patient offers no complaints at this time. Plan for outpatient follow up.  Patient at this time appears safe and stable for discharge and close outpatient follow up. Discharge plan and strict return to ED precautions discussed, patient verbalizes understanding and  agreement. ? ? ? ? ? ? ? ?Final Clinical Impression(s) / ED Diagnoses ?Final diagnoses:  ?Weakness  ? ? ?Rx / DC Orders ?ED Discharge Orders   ? ? None  ? ?  ? ? ?  ?Lorelle Gibbs, DO ?06/05/21 2313 ? ?

## 2021-06-05 NOTE — ED Triage Notes (Signed)
Patient reports to the ER [facility] from [employer] after eating at [facility]. Patient reports she started having some nausea and emesis while walking around the the [employer]. Patient endorses weakness and feeling close to faint.  ? ?Patient reports nausea resolved following her episode of emesis.  ? ?132/62 ?HR 56 ?96% on RA ?CBG 131 ?

## 2021-06-15 ENCOUNTER — Other Ambulatory Visit: Payer: Self-pay | Admitting: Internal Medicine

## 2021-06-15 DIAGNOSIS — I129 Hypertensive chronic kidney disease with stage 1 through stage 4 chronic kidney disease, or unspecified chronic kidney disease: Secondary | ICD-10-CM

## 2021-06-15 DIAGNOSIS — N2889 Other specified disorders of kidney and ureter: Secondary | ICD-10-CM

## 2021-06-15 DIAGNOSIS — N179 Acute kidney failure, unspecified: Secondary | ICD-10-CM

## 2021-06-15 DIAGNOSIS — R809 Proteinuria, unspecified: Secondary | ICD-10-CM

## 2021-06-26 ENCOUNTER — Ambulatory Visit
Admission: RE | Admit: 2021-06-26 | Discharge: 2021-06-26 | Disposition: A | Discharge status: Home / Self Care | Payer: Medicare Other | Source: Ambulatory Visit | Attending: Internal Medicine | Admitting: Internal Medicine

## 2021-06-26 DIAGNOSIS — N179 Acute kidney failure, unspecified: Secondary | ICD-10-CM

## 2021-06-26 DIAGNOSIS — R809 Proteinuria, unspecified: Secondary | ICD-10-CM

## 2021-06-26 DIAGNOSIS — I129 Hypertensive chronic kidney disease with stage 1 through stage 4 chronic kidney disease, or unspecified chronic kidney disease: Secondary | ICD-10-CM

## 2021-06-26 DIAGNOSIS — N2889 Other specified disorders of kidney and ureter: Secondary | ICD-10-CM

## 2022-02-19 ENCOUNTER — Other Ambulatory Visit: Payer: Self-pay | Admitting: Family Medicine

## 2022-02-19 DIAGNOSIS — Z1231 Encounter for screening mammogram for malignant neoplasm of breast: Secondary | ICD-10-CM

## 2022-04-10 ENCOUNTER — Ambulatory Visit: Payer: Medicare Other

## 2022-06-10 ENCOUNTER — Emergency Department (HOSPITAL_BASED_OUTPATIENT_CLINIC_OR_DEPARTMENT_OTHER)
Admission: EM | Admit: 2022-06-10 | Discharge: 2022-06-10 | Disposition: A | Discharge status: Home / Self Care | Payer: Medicare Other | Attending: Emergency Medicine | Admitting: Emergency Medicine

## 2022-06-10 ENCOUNTER — Encounter (HOSPITAL_BASED_OUTPATIENT_CLINIC_OR_DEPARTMENT_OTHER): Payer: Self-pay

## 2022-06-10 ENCOUNTER — Other Ambulatory Visit: Payer: Self-pay

## 2022-06-10 ENCOUNTER — Emergency Department (HOSPITAL_BASED_OUTPATIENT_CLINIC_OR_DEPARTMENT_OTHER): Payer: Medicare Other | Admitting: Radiology

## 2022-06-10 DIAGNOSIS — R600 Localized edema: Secondary | ICD-10-CM | POA: Insufficient documentation

## 2022-06-10 DIAGNOSIS — Z79899 Other long term (current) drug therapy: Secondary | ICD-10-CM | POA: Diagnosis not present

## 2022-06-10 DIAGNOSIS — Z7982 Long term (current) use of aspirin: Secondary | ICD-10-CM | POA: Diagnosis not present

## 2022-06-10 DIAGNOSIS — I10 Essential (primary) hypertension: Secondary | ICD-10-CM | POA: Insufficient documentation

## 2022-06-10 DIAGNOSIS — R2243 Localized swelling, mass and lump, lower limb, bilateral: Secondary | ICD-10-CM | POA: Diagnosis present

## 2022-06-10 LAB — CBC
HCT: 33.1 % — ABNORMAL LOW (ref 36.0–46.0)
Hemoglobin: 10.6 g/dL — ABNORMAL LOW (ref 12.0–15.0)
MCH: 28.4 pg (ref 26.0–34.0)
MCHC: 32 g/dL (ref 30.0–36.0)
MCV: 88.7 fL (ref 80.0–100.0)
Platelets: 290 10*3/uL (ref 150–400)
RBC: 3.73 MIL/uL — ABNORMAL LOW (ref 3.87–5.11)
RDW: 13.2 % (ref 11.5–15.5)
WBC: 5.6 10*3/uL (ref 4.0–10.5)
nRBC: 0 % (ref 0.0–0.2)

## 2022-06-10 LAB — BASIC METABOLIC PANEL
Anion gap: 10 (ref 5–15)
BUN: 21 mg/dL (ref 8–23)
CO2: 24 mmol/L (ref 22–32)
Calcium: 9.5 mg/dL (ref 8.9–10.3)
Chloride: 105 mmol/L (ref 98–111)
Creatinine, Ser: 1.16 mg/dL — ABNORMAL HIGH (ref 0.44–1.00)
GFR, Estimated: 47 mL/min — ABNORMAL LOW (ref >60)
Glucose, Bld: 100 mg/dL — ABNORMAL HIGH (ref 70–99)
Potassium: 3.6 mmol/L (ref 3.5–5.1)
Sodium: 139 mmol/L (ref 135–145)

## 2022-06-10 LAB — BRAIN NATRIURETIC PEPTIDE: B Natriuretic Peptide: 177.8 pg/mL — ABNORMAL HIGH (ref 0.0–100.0)

## 2022-06-10 MED ORDER — FUROSEMIDE 20 MG PO TABS
10.0000 mg | ORAL_TABLET | Freq: Once | ORAL | Status: AC
  Administered 2022-06-10: 10 mg via ORAL
  Filled 2022-06-10: qty 1

## 2022-06-10 MED ORDER — FUROSEMIDE 20 MG PO TABS: 10.0000 mg | ORAL_TABLET | Freq: Every day | ORAL | 0 refills | Status: DC

## 2022-06-10 NOTE — ED Provider Notes (Signed)
[place] EMERGENCY DEPARTMENT AT [facility] Provider Note   CSN: [id number] Arrival date & time: 06/10/22  1758     History  Chief Complaint  Patient presents with   Leg Swelling    Kathryn Haley is a 82 y.o. female.  With PMH of HTN, HLD, grade 1 diastolic dysfunction presenting with lower extremity swelling.  HPI     Home Medications Prior to Admission medications   Medication Sig Start Date End Date Taking? Authorizing Provider  amLODipine (NORVASC) 5 MG tablet Take 5 mg by mouth at bedtime.     [provider]  aspirin EC 81 MG tablet Take 81 mg by mouth daily.    [provider]  cholecalciferol (VITAMIN D) 1000 UNITS tablet Take 1 tablet (1,000 Units total) by mouth daily. 06/08/14   Magrinat, Valentino Hue, MD  cyanocobalamin 100 MCG tablet Take 100 mcg by mouth daily.    [provider]  ferrous sulfate 325 (65 FE) MG tablet Take 325 mg by mouth daily with breakfast.    [provider]  meclizine (ANTIVERT) 12.5 MG tablet Take 12.5 mg by mouth 2 (two) times daily as needed for dizziness.    [provider]  mirtazapine (REMERON) 15 MG tablet Take 1 tablet (15 mg total) by mouth at bedtime. 11/20/17   Magrinat, Valentino Hue, MD  rosuvastatin (CRESTOR) 10 MG tablet Take 10 mg by mouth every evening. Pt takes Brand name of this medication    [provider]  triamcinolone cream (KENALOG) 0.1 % Apply 1 application topically 2 (two) times daily.    [provider]  verapamil (VERELAN PM) 240 MG 24 hr capsule Take 240 mg by mouth every morning.     [provider]      Allergies    Ciprofloxacin, Decadron [dexamethasone], Other, Penicillins, Sulfa antibiotics, Keflex [cephalexin], and Toviaz [fesoterodine fumarate er]    Review of Systems   Review of Systems  Physical Exam Updated Vital Signs BP (!) 142/79 (BP Location: Right Arm)   Pulse 66   Temp 97.8 F (36.6 C)   Resp 20   Ht 5\' 5"  (1.651 m)    Wt 53.1 kg   SpO2 100%   BMI 19.47 kg/m  Physical Exam  ED Results / Procedures / Treatments   Labs (all labs ordered are listed, but only abnormal results are displayed) Labs Reviewed  BASIC METABOLIC PANEL - Abnormal; Notable for the following components:      Result Value   Glucose, Bld 100 (*)    Creatinine, Ser 1.16 (*)    GFR, Estimated 47 (*)    All other components within normal limits  CBC - Abnormal; Notable for the following components:   RBC 3.73 (*)    Hemoglobin 10.6 (*)    HCT 33.1 (*)    All other components within normal limits  BRAIN NATRIURETIC PEPTIDE - Abnormal; Notable for the following components:   B Natriuretic Peptide 177.8 (*)    All other components within normal limits    EKG None  Radiology No results found.  Procedures Procedures  {Document cardiac monitor, telemetry assessment procedure when appropriate:1}  Medications Ordered in ED Medications - No data to display  ED Course/ Medical Decision Making/ A&P   {   Click here for ABCD2, HEART and other calculatorsREFRESH Note before signing :1}  Medical Decision Making Amount and/or Complexity of Data Reviewed Labs: ordered. Radiology: ordered.   ***  {Document critical care time when appropriate:1} {Document review of labs and clinical decision tools ie heart score, Chads2Vasc2 etc:1}  {Document your independent review of radiology images, and any outside records:1} {Document your discussion with family members, caretakers, and with consultants:1} {Document social determinants of health affecting pt's care:1} {Document your decision making why or why not admission, treatments were needed:1} Final Clinical Impression(s) / ED Diagnoses Final diagnoses:  None    Rx / DC Orders ED Discharge Orders     None

## 2022-06-10 NOTE — Discharge Instructions (Addendum)
Wear compression stockings and elevate your legs when resting to help with the swelling.  We have also given you a prescription for Lasix to help pee off the fluid.  Follow-up with your doctor and cardiology regarding your visit to the ER today for further workup and management of peripheral edema or leg swelling.  You will likely need an ultrasound of your heart (echocardiogram) at some point performed outpatient.

## 2022-06-10 NOTE — ED Triage Notes (Signed)
Patient here POV from Home.  Endorses Bilateral Lower Extremity Swelling for approximately 2 Weeks. Slowly worsening since it began. No CP. No SOB. No Anticoagulants.   NAD Noted during Triage. A&Ox4. GCS 15. BIB Wheelchair.

## 2022-06-10 NOTE — ED Notes (Signed)
Assisted patient out of friends car in front of lobby.  Friend left and will return when patient is discharged.  Patient BIB wheelchair by this EMT.

## 2022-07-29 ENCOUNTER — Ambulatory Visit: Payer: Medicare Other

## 2022-07-31 ENCOUNTER — Ambulatory Visit: Payer: Medicare Other

## 2022-09-26 ENCOUNTER — Observation Stay (HOSPITAL_BASED_OUTPATIENT_CLINIC_OR_DEPARTMENT_OTHER)
Admission: EM | Admit: 2022-09-26 | Discharge: 2022-09-27 | Disposition: A | Discharge status: Home / Self Care | Payer: Medicare Other | Attending: Internal Medicine | Admitting: Internal Medicine

## 2022-09-26 ENCOUNTER — Encounter (HOSPITAL_BASED_OUTPATIENT_CLINIC_OR_DEPARTMENT_OTHER): Payer: Self-pay | Admitting: Pediatrics

## 2022-09-26 ENCOUNTER — Other Ambulatory Visit: Payer: Self-pay

## 2022-09-26 ENCOUNTER — Emergency Department (HOSPITAL_BASED_OUTPATIENT_CLINIC_OR_DEPARTMENT_OTHER): Payer: Medicare Other

## 2022-09-26 DIAGNOSIS — I440 Atrioventricular block, first degree: Secondary | ICD-10-CM

## 2022-09-26 DIAGNOSIS — R519 Headache, unspecified: Secondary | ICD-10-CM | POA: Diagnosis not present

## 2022-09-26 DIAGNOSIS — R55 Syncope and collapse: Secondary | ICD-10-CM

## 2022-09-26 DIAGNOSIS — R42 Dizziness and giddiness: Secondary | ICD-10-CM | POA: Insufficient documentation

## 2022-09-26 DIAGNOSIS — R001 Bradycardia, unspecified: Secondary | ICD-10-CM | POA: Diagnosis present

## 2022-09-26 DIAGNOSIS — Z853 Personal history of malignant neoplasm of breast: Secondary | ICD-10-CM | POA: Diagnosis not present

## 2022-09-26 DIAGNOSIS — I10 Essential (primary) hypertension: Secondary | ICD-10-CM | POA: Diagnosis not present

## 2022-09-26 LAB — CBC
HCT: 34.5 % — ABNORMAL LOW (ref 36.0–46.0)
Hemoglobin: 11.2 g/dL — ABNORMAL LOW (ref 12.0–15.0)
MCH: 28.3 pg (ref 26.0–34.0)
MCHC: 32.5 g/dL (ref 30.0–36.0)
MCV: 87.1 fL (ref 80.0–100.0)
Platelets: 314 10*3/uL (ref 150–400)
RBC: 3.96 MIL/uL (ref 3.87–5.11)
RDW: 13.2 % (ref 11.5–15.5)
WBC: 6.8 10*3/uL (ref 4.0–10.5)
nRBC: 0 % (ref 0.0–0.2)

## 2022-09-26 LAB — CBG MONITORING, ED: Glucose-Capillary: 109 mg/dL — ABNORMAL HIGH (ref 70–99)

## 2022-09-26 LAB — URINALYSIS, ROUTINE W REFLEX MICROSCOPIC
Bilirubin Urine: NEGATIVE
Glucose, UA: NEGATIVE mg/dL
Hgb urine dipstick: NEGATIVE
Ketones, ur: NEGATIVE mg/dL
Nitrite: NEGATIVE
Protein, ur: 30 mg/dL — AB
Specific Gravity, Urine: 1.023 (ref 1.005–1.030)
pH: 5.5 (ref 5.0–8.0)

## 2022-09-26 LAB — BASIC METABOLIC PANEL
Anion gap: 8 (ref 5–15)
BUN: 20 mg/dL (ref 8–23)
CO2: 25 mmol/L (ref 22–32)
Calcium: 9.3 mg/dL (ref 8.9–10.3)
Chloride: 107 mmol/L (ref 98–111)
Creatinine, Ser: 1.3 mg/dL — ABNORMAL HIGH (ref 0.44–1.00)
GFR, Estimated: 41 mL/min — ABNORMAL LOW (ref >60)
Glucose, Bld: 152 mg/dL — ABNORMAL HIGH (ref 70–99)
Potassium: 3.7 mmol/L (ref 3.5–5.1)
Sodium: 140 mmol/L (ref 135–145)

## 2022-09-26 LAB — MAGNESIUM: Magnesium: 2.1 mg/dL (ref 1.7–2.4)

## 2022-09-26 LAB — TROPONIN I (HIGH SENSITIVITY)
Troponin I (High Sensitivity): 16 ng/L (ref <18)
Troponin I (High Sensitivity): 16 ng/L (ref <18)

## 2022-09-26 LAB — TSH: TSH: 1.877 u[IU]/mL (ref 0.350–4.500)

## 2022-09-26 LAB — LIPASE, BLOOD: Lipase: 18 U/L (ref 11–51)

## 2022-09-26 MED ORDER — LOSARTAN POTASSIUM 25 MG PO TABS
25.0000 mg | ORAL_TABLET | Freq: Every day | ORAL | Status: DC
  Administered 2022-09-26 – 2022-09-27 (×2): 25 mg via ORAL
  Filled 2022-09-26 (×2): qty 1

## 2022-09-26 MED ORDER — ONDANSETRON HCL 4 MG/2ML IJ SOLN: 4.0000 mg | Freq: Four times a day (QID) | INTRAMUSCULAR | Status: DC | PRN

## 2022-09-26 MED ORDER — SODIUM CHLORIDE 0.9 % IV BOLUS: 1000.0000 mL | Freq: Once | INTRAVENOUS | Status: DC

## 2022-09-26 MED ORDER — POLYETHYLENE GLYCOL 3350 17 G PO PACK: 17.0000 g | PACK | Freq: Every day | ORAL | Status: DC | PRN

## 2022-09-26 MED ORDER — SODIUM CHLORIDE 0.9 % IV SOLN: 250.0000 mL | INTRAVENOUS | Status: DC | PRN

## 2022-09-26 MED ORDER — ONDANSETRON HCL 4 MG PO TABS: 4.0000 mg | ORAL_TABLET | Freq: Four times a day (QID) | ORAL | Status: DC | PRN

## 2022-09-26 MED ORDER — AMLODIPINE BESYLATE 10 MG PO TABS
10.0000 mg | ORAL_TABLET | Freq: Every day | ORAL | Status: DC
  Administered 2022-09-27: 10 mg via ORAL
  Filled 2022-09-26 (×2): qty 1

## 2022-09-26 MED ORDER — SODIUM CHLORIDE 0.9% FLUSH: 3.0000 mL | Freq: Two times a day (BID) | INTRAVENOUS | Status: DC

## 2022-09-26 MED ORDER — HYDRALAZINE HCL 20 MG/ML IJ SOLN: 10.0000 mg | Freq: Four times a day (QID) | INTRAMUSCULAR | Status: DC | PRN

## 2022-09-26 MED ORDER — ACETAMINOPHEN 650 MG RE SUPP: 650.0000 mg | Freq: Four times a day (QID) | RECTAL | Status: DC | PRN

## 2022-09-26 MED ORDER — ENOXAPARIN SODIUM 30 MG/0.3ML IJ SOSY
30.0000 mg | PREFILLED_SYRINGE | INTRAMUSCULAR | Status: DC
  Administered 2022-09-26: 30 mg via SUBCUTANEOUS
  Filled 2022-09-26: qty 0.3

## 2022-09-26 MED ORDER — SODIUM CHLORIDE 0.9% FLUSH: 3.0000 mL | INTRAVENOUS | Status: DC | PRN

## 2022-09-26 MED ORDER — SODIUM CHLORIDE 0.9 % IV SOLN: Freq: Once | INTRAVENOUS | Status: AC

## 2022-09-26 MED ORDER — ACETAMINOPHEN 325 MG PO TABS: 650.0000 mg | ORAL_TABLET | Freq: Four times a day (QID) | ORAL | Status: DC | PRN

## 2022-09-26 MED ORDER — SODIUM CHLORIDE 0.9 % IV SOLN: INTRAVENOUS | Status: AC

## 2022-09-26 NOTE — ED Triage Notes (Signed)
C/O vomiting and episodes of faint started this afternoon.

## 2022-09-26 NOTE — ED Notes (Signed)
Monisha with cl called for transport

## 2022-09-26 NOTE — Consult Note (Signed)
Cardiology Consultation   Patient ID: Kathryn Haley MRN: [id number]; DOB: Sep 17, 1940  Admit date: 09/26/2022 Date of Consult: 09/26/2022  PCP:  Farris Has, MD    [facility] Providers Cardiologist:  Reatha Harps, MD        Patient Profile:   Kathryn Haley is a 82 y.o. female with a history of mild valvular disease (mild aortic, mitral, and tricuspid regurgitation) noted on Echo in 2013, hypertension, hyperlipidemia, breast cancer in 2016, and anxiety who is being seen 09/26/2022 for the evaluation of bradycardia at the request of Dr. Wallace Cullens.  History of Present Illness:   Kathryn Haley is a 82 year old female with the above history. She has a history of mild valvular diease but no other known cardiac history. Remote Echo in 2013 showed LVEF of 55-60% with mild LVH, grade 1 diastolic dysfunction, mild AI, mild MR, and mild TR. She does not follow with Cardiology.  Patient presented to the [facility] ED today for further evaluation of headache and near syncope.  She reports around 1 PM she got into the car with her friend.  She describes an episode of feeling dizzy and lightheaded.  She tells me she was able to talk and did not completely lose consciousness.  Symptoms lasted several seconds and resolved.  She reports no chest pain or shortness of breath.  She describes no fevers or chills recently.  She tells me that she had breakfast this morning and only had a little bit to eat.  She tells me she had 4 ounces of water and that was it for the entire day.  She does not drink water regularly.  She reports no infectious symptoms.  She does report weight loss.  She tells me over the past 10 to 12 years she is lost 50 to 60 pounds.  She describes dysphagia as well as weight loss that is unintentional.  She describes no dark stools or any other issues.  She is followed by her primary care physician and tells me that nothing has been done about this.  Upon arrival to the ED, patient was mildly  bradycardic. EKG showed sinus bradycardia, rate 51 bpm, with slightly elevated concave ST segment in inferior leads and leads V4-V6 (not consistent with STEMI). High-sensitivity troponin negative x2. WBC 6.8, Hgb 11.2, Plts 314. Na 140, K 3.7, Glucose 152, BUN 20, Cr 1.30. Lipase normal. Urinalysis showed trace leukocytes, few bacteria, and 30 of protein. Head CT showed no acute intracranial abnormalities. Patient was transferred to [facility] for admission under Internal Medicine service and Cardiology consulted for further evaluation.   Past Medical History:  Diagnosis Date   Anxiety    Arthritis    Breast cancer (HCC)    Breast cancer of upper-outer quadrant of left female breast (HCC) 04/11/2014   Family history of breast cancer    Heart murmur    echo 2013-mild regurg-aortic,mitral,tricu   Hypercholesteremia    Hypertension    Urinary frequency     Past Surgical History:  Procedure Laterality Date   BREAST EXCISIONAL BIOPSY     BREAST LUMPECTOMY     left 2016   BREAST LUMPECTOMY WITH NEEDLE LOCALIZATION AND AXILLARY SENTINEL LYMPH NODE BX Left 05/09/2014   Procedure: BRACKETED WIRE LOCALIZED LEFT BREAST LUMPECTOMY WITH LEFT  AXILLARY SENTINEL LYMPH NODE BX;  Surgeon: Glenna Fellows, MD;  Location: [facility];  Service: General;  Laterality: Left;   BREAST SURGERY     lt breast bx  x3-negative   COLONOSCOPY     EYE SURGERY     both cataracts   TONSILLECTOMY       Home Medications:  Prior to Admission medications   Medication Sig Start Date End Date Taking? Authorizing Provider  amLODipine (NORVASC) 5 MG tablet Take 5 mg by mouth at bedtime.    Yes [provider]  cyanocobalamin 100 MCG tablet Take 100 mcg by mouth daily.   Yes [provider]  furosemide (LASIX) 20 MG tablet Take 0.5 tablets (10 mg total) by mouth daily for 4 days. 06/11/22 09/26/22 Yes Mardene Sayer, MD  rosuvastatin (CRESTOR) 10 MG tablet Take 10 mg by mouth every  evening. Pt takes Brand name of this medication   Yes [provider]  triamcinolone cream (KENALOG) 0.1 % Apply 1 application topically 2 (two) times daily.   Yes [provider]  verapamil (VERELAN PM) 240 MG 24 hr capsule Take 240 mg by mouth every morning.    Yes [provider]  cholecalciferol (VITAMIN D) 1000 UNITS tablet Take 1 tablet (1,000 Units total) by mouth daily. 06/08/14   Magrinat, Valentino Hue, MD  mirtazapine (REMERON) 15 MG tablet Take 1 tablet (15 mg total) by mouth at bedtime. Patient not taking: Reported on 09/26/2022 11/20/17   Magrinat, Valentino Hue, MD    Inpatient Medications: Scheduled Meds:  [START ON 09/27/2022] amLODipine  10 mg Oral Daily   losartan  25 mg Oral Daily   Continuous Infusions:  sodium chloride     PRN Meds:   Allergies:    Allergies  Allergen Reactions   Ciprofloxacin Shortness Of Breath   Decadron [Dexamethasone] Anaphylaxis, Swelling and Rash    Swelling of lips and tongue   Other Anaphylaxis    Patient had antibiotic shot in a MD office , does not know what name of drug is.   Penicillins Nausea And Vomiting    VOMITTING   Sulfa Antibiotics Swelling   Keflex [Cephalexin] Rash    SKIN RASH   Toviaz [Fesoterodine Fumarate Er] Rash and Other (See Comments)    DIZZINESS    Social History:   Social History   Socioeconomic History   Marital status: Legally Separated    Spouse name: Not on file   Number of children: 2   Years of education: Not on file   Highest education level: Not on file  Occupational History   Occupation: retired  Tobacco Use   Smoking status: Never    Passive exposure: Past   Smokeless tobacco: Never  Vaping Use   Vaping status: Never Used  Substance and Sexual Activity   Alcohol use: No   Drug use: No   Sexual activity: Not Currently  Other Topics Concern   Not on file  Social History Narrative   Not on file   Social Determinants of Health   Financial Resource Strain: Not on  file  Food Insecurity: Not on file  Transportation Needs: Not on file  Physical Activity: Not on file  Stress: Not on file  Social Connections: Not on file  Intimate Partner Violence: Not on file    Family History:    Family History  Problem Relation Age of Onset   Brain cancer Sister    Breast cancer Daughter    Breast cancer Daughter        youngest daughter possibly had genetic testing     ROS:  Please see the history of present illness.   All other ROS reviewed  and negative.     Physical Exam/Data:   Vitals:   09/26/22 1500 09/26/22 1630 09/26/22 1701 09/26/22 1741  BP: 135/60 (!) 154/82    Pulse: (!) 43 60    Resp: (!) 21 16    Temp:   98.3 F (36.8 C)   TempSrc:   Oral   SpO2: 100% 100%    Weight:    51.1 kg  Height:       No intake or output data in the 24 hours ending 09/26/22 1808    09/26/2022    5:41 PM 09/26/2022   12:42 PM 06/10/2022    6:43 PM  Last 3 Weights  Weight (lbs) 112 lb 10.5 oz 116 lb 117 lb  Weight (kg) 51.1 kg 52.617 kg 53.071 kg     Body mass index is 19.96 kg/m.  General:  Well nourished, well developed, in no acute distress HEENT: normal Neck: no JVD Vascular: No carotid bruits; Distal pulses 2+ bilaterally Cardiac:  normal S1, S2; 2 out of 6 holosystolic murmur Lungs:  clear to auscultation bilaterally, no wheezing, rhonchi or rales  Abd: soft, nontender, no hepatomegaly  Ext: no edema Musculoskeletal:  No deformities, BUE and BLE strength normal and equal Skin: warm and dry  Neuro:  CNs 2-12 intact, no focal abnormalities noted Psych:  Normal affect   EKG:  The EKG was personally reviewed and demonstrates:  Sinus bradycardia, rate 51 bpm, with slightly elevated concave ST segment in inferior leads and leads V4-V6 (not consistent with STEMI). She has had similar ST changes on prior EKG in 2021-10-25 but slightly more pronounced on tracing today. Telemetry:  Telemetry was personally reviewed and demonstrates: Sinus rhythm 60 to 70  bpm  Relevant CV Studies:  Echocardiogram 05/28/2011: Study Conclusions: - Left ventricle: The cavity size was normal. Wall thickness    was increased in a pattern of mild LVH. There was mild    concentric hypertrophy. Systolic function was normal. The    estimated ejection fraction was in the range of 55% to    60%. Wall motion was normal; there were no regional wall    motion abnormalities. Doppler parameters are consistent    with abnormal left ventricular relaxation (grade 1    diastolic dysfunction). Doppler parameters are consistent    with elevated mean left atrial filling pressure.  - Aortic valve: Mild regurgitation. Valve area:    2.98cm^2(VTI). Valve area: 2.59cm^2 (Vmax).  - Mitral valve: Mildly calcified annulus. Mild    regurgitation.  - Right ventricle: Systolic pressure was increased.  - Atrial septum: No defect or patent foramen ovale was    identified.  - Tricuspid valve: Mild regurgitation.  - Pulmonary arteries: PA peak pressure: 36mm Hg (S).  Impressions:   - The right ventricular systolic pressure was increased    consistent with mild pulmonary hypertension.    Laboratory Data:  High Sensitivity Troponin:   Recent Labs  Lab 09/26/22 1300 09/26/22 1531  TROPONINIHS 16 16     Chemistry Recent Labs  Lab 09/26/22 1300  NA 140  K 3.7  CL 107  CO2 25  GLUCOSE 152*  BUN 20  CREATININE 1.30*  CALCIUM 9.3  GFRNONAA 41*  ANIONGAP 8    No results for input(s): "PROT", "ALBUMIN", "AST", "ALT", "ALKPHOS", "BILITOT" in the last 168 hours. Lipids No results for input(s): "CHOL", "TRIG", "HDL", "LABVLDL", "LDLCALC", "CHOLHDL" in the last 168 hours.  Hematology Recent Labs  Lab 09/26/22 1300  WBC 6.8  RBC  3.96  HGB 11.2*  HCT 34.5*  MCV 87.1  MCH 28.3  MCHC 32.5  RDW 13.2  PLT 314   Thyroid No results for input(s): "TSH", "FREET4" in the last 168 hours.  BNPNo results for input(s): "BNP", "PROBNP" in the last 168 hours.  DDimer No results for  input(s): "DDIMER" in the last 168 hours.   Radiology/Studies:  CT Head Wo Contrast  Result Date: 09/26/2022 CLINICAL DATA:  Vomiting. EXAM: CT HEAD WITHOUT CONTRAST TECHNIQUE: Contiguous axial images were obtained from the base of the skull through the vertex without intravenous contrast. RADIATION DOSE REDUCTION: This exam was performed according to the departmental dose-optimization program which includes automated exposure control, adjustment of the mA and/or kV according to patient size and/or use of iterative reconstruction technique. COMPARISON:  May 27, 2011 FINDINGS: Brain: There is mild cerebral atrophy with widening of the extra-axial spaces and ventricular dilatation. There are areas of decreased attenuation within the white matter tracts of the supratentorial brain, consistent with microvascular disease changes. Vascular: There is marked severity bilateral cavernous carotid artery calcification. Skull: Normal. Negative for fracture or focal lesion. Sinuses/Orbits: No acute finding. Other: None. IMPRESSION: 1. Generalized cerebral atrophy with chronic white matter small vessel ischemic changes. 2. No acute intracranial abnormality. Electronically Signed   By: Aram Candela M.D.   On: 09/26/2022 15:11     Assessment and Plan:   # Presyncope # Vasovagal episode versus orthostatic intolerance # Sinus bradycardia -She reports an episode of dizziness and lightheadedness but no syncope.  She tells me she only had 4 ounces of water to drink today.  She only had breakfast.  The episode occurred at 1 PM today.  She reports no chest pain or trouble breathing.  Troponins are negative.  EKG does show sinus bradycardia however she did take her medications this morning which include verapamil.  Highly suspect this was related to dehydration and medication.  She has had sinus bradycardia in the past.  She has narrow intervals.  I see no indications for pacing.  Would recommend to let her eat and  continue with intravenous fluids.  Hold her verapamil.  We will adjust her blood pressure medications below. -She does have a 2 out of 6 holosystolic murmur.  Noted to have mild mitral valve and tricuspid regurgitation several years ago.  We will repeat her echo. -check TSH  # HTN -Stop verapamil due to bradycardia.  Continue amlodipine 10 mg daily.  Add losartan 25 mg daily.  Further titration for better blood pressure control.  # Weight loss # Dysphagia -She describes weight loss over several years and dysphagia.  Reports she is losing weight and describes early satiety.  She may benefit from further evaluation.  I will defer this to the hospital medicine team.  For questions or updates, please contact [facility] Please consult [email or web address] for contact info under    [employer] Flora Lipps, MD, [facility]  [facility]  [address], [address] [place], [place] [address] [phone]  6:08 PM

## 2022-09-26 NOTE — ED Provider Notes (Addendum)
[facility] AT [facility] Provider Note   CSN: [id number] Arrival date & time: 09/26/22  1228     History  Chief Complaint  Patient presents with   Emesis   Loss of Consciousness    Kathryn Haley is a 82 y.o. female.  Patient is a 82 yo female presenting for headache and loc. Patient states acute onset headache, was on her way to the car when she "had feelings that I was going to pass out". Patient denies loc but states she felt light headed and faint while in the seating position. No head trauma. States she vomited upon entering the emergency department. Denies blood thinner use. Denies recent falls or blunt head trauma. Denies URI symptoms, nausea, vomiting, fever, chills, coughing, or diarrhea prior to todays events. Denies black or bloody stools. Denies chest pain. Some back pain that she states is chronic. Admits to mild umbilical abdominal discomfort that she attributes to being nervous from the ED.  States this morning, prior to headache, she woke up feeling well. No complaints. Eat breakfast.    The history is provided by the patient. No language interpreter was used.  Emesis Associated symptoms: headaches   Associated symptoms: no abdominal pain, no arthralgias, no chills, no cough, no fever and no sore throat   Loss of Consciousness Associated symptoms: headaches and vomiting   Associated symptoms: no chest pain, no fever, no palpitations, no seizures and no shortness of breath        Home Medications Prior to Admission medications   Medication Sig Start Date End Date Taking? Authorizing Provider  amLODipine (NORVASC) 5 MG tablet Take 5 mg by mouth at bedtime.     [provider]  aspirin EC 81 MG tablet Take 81 mg by mouth daily.    [provider]  cholecalciferol (VITAMIN D) 1000 UNITS tablet Take 1 tablet (1,000 Units total) by mouth daily. 06/08/14   Magrinat, Valentino Hue, MD  cyanocobalamin 100 MCG tablet Take 100 mcg by  mouth daily.    [provider]  ferrous sulfate 325 (65 FE) MG tablet Take 325 mg by mouth daily with breakfast.    [provider]  furosemide (LASIX) 20 MG tablet Take 0.5 tablets (10 mg total) by mouth daily for 4 days. 06/11/22 06/15/22  Mardene Sayer, MD  meclizine (ANTIVERT) 12.5 MG tablet Take 12.5 mg by mouth 2 (two) times daily as needed for dizziness.    [provider]  mirtazapine (REMERON) 15 MG tablet Take 1 tablet (15 mg total) by mouth at bedtime. 11/20/17   Magrinat, Valentino Hue, MD  rosuvastatin (CRESTOR) 10 MG tablet Take 10 mg by mouth every evening. Pt takes Brand name of this medication    [provider]  triamcinolone cream (KENALOG) 0.1 % Apply 1 application topically 2 (two) times daily.    [provider]  verapamil (VERELAN PM) 240 MG 24 hr capsule Take 240 mg by mouth every morning.     [provider]      Allergies    Ciprofloxacin, Decadron [dexamethasone], Other, Penicillins, Sulfa antibiotics, Keflex [cephalexin], and Toviaz [fesoterodine fumarate er]    Review of Systems   Review of Systems  Constitutional:  Negative for chills and fever.  HENT:  Negative for ear pain and sore throat.   Eyes:  Negative for pain and visual disturbance.  Respiratory:  Negative for cough and shortness of breath.   Cardiovascular:  Positive for syncope. Negative for  chest pain and palpitations.  Gastrointestinal:  Positive for vomiting. Negative for abdominal pain.  Genitourinary:  Negative for dysuria and hematuria.  Musculoskeletal:  Negative for arthralgias and back pain.  Skin:  Negative for color change and rash.  Neurological:  Positive for syncope and headaches. Negative for seizures.  All other systems reviewed and are negative.   Physical Exam Updated Vital Signs BP 135/60   Pulse (!) 43   Temp 98 F (36.7 C)   Resp (!) 21   Ht 5\' 3"  (1.6 m)   Wt 52.6 kg   SpO2 100%   BMI 20.55 kg/m  Physical  Exam Vitals and nursing note reviewed.  Constitutional:      General: She is not in acute distress.    Appearance: She is well-developed.  HENT:     Head: Normocephalic and atraumatic.  Eyes:     Conjunctiva/sclera: Conjunctivae normal.  Cardiovascular:     Rate and Rhythm: Normal rate and regular rhythm.     Heart sounds: No murmur heard. Pulmonary:     Effort: Pulmonary effort is normal. No respiratory distress.     Breath sounds: Normal breath sounds.  Abdominal:     Palpations: Abdomen is soft.     Tenderness: There is no abdominal tenderness.  Musculoskeletal:        General: No swelling.     Cervical back: Neck supple.  Skin:    General: Skin is warm and dry.     Capillary Refill: Capillary refill takes less than 2 seconds.  Neurological:     Mental Status: She is alert.  Psychiatric:        Mood and Affect: Mood normal.     ED Results / Procedures / Treatments   Labs (all labs ordered are listed, but only abnormal results are displayed) Labs Reviewed  BASIC METABOLIC PANEL - Abnormal; Notable for the following components:      Result Value   Glucose, Bld 152 (*)    Creatinine, Ser 1.30 (*)    GFR, Estimated 41 (*)    All other components within normal limits  CBC - Abnormal; Notable for the following components:   Hemoglobin 11.2 (*)    HCT 34.5 (*)    All other components within normal limits  URINALYSIS, ROUTINE W REFLEX MICROSCOPIC - Abnormal; Notable for the following components:   APPearance HAZY (*)    Protein, ur 30 (*)    Leukocytes,Ua TRACE (*)    Bacteria, UA FEW (*)    All other components within normal limits  CBG MONITORING, ED - Abnormal; Notable for the following components:   Glucose-Capillary 109 (*)    All other components within normal limits  LIPASE, BLOOD  CBG MONITORING, ED  TROPONIN I (HIGH SENSITIVITY)  TROPONIN I (HIGH SENSITIVITY)    EKG EKG Interpretation Date/Time:  Thursday September 26 2022 12:44:43 EDT Ventricular  Rate:  51 PR Interval:  150 QRS Duration:  88 QT Interval:  477 QTC Calculation: 440 R Axis:   68  Text Interpretation: Sinus bradycardia RSR' in V1 or V2, probably normal variant ST elevation, consider inferior injury Confirmed by Edwin Dada ([id number]) on 09/26/2022 2:11:31 PM  Radiology CT Head Wo Contrast  Result Date: 09/26/2022 CLINICAL DATA:  Vomiting. EXAM: CT HEAD WITHOUT CONTRAST TECHNIQUE: Contiguous axial images were obtained from the base of the skull through the vertex without intravenous contrast. RADIATION DOSE REDUCTION: This exam was performed according to the departmental dose-optimization program which includes automated exposure  control, adjustment of the mA and/or kV according to patient size and/or use of iterative reconstruction technique. COMPARISON:  May 27, 2011 FINDINGS: Brain: There is mild cerebral atrophy with widening of the extra-axial spaces and ventricular dilatation. There are areas of decreased attenuation within the white matter tracts of the supratentorial brain, consistent with microvascular disease changes. Vascular: There is marked severity bilateral cavernous carotid artery calcification. Skull: Normal. Negative for fracture or focal lesion. Sinuses/Orbits: No acute finding. Other: None. IMPRESSION: 1. Generalized cerebral atrophy with chronic white matter small vessel ischemic changes. 2. No acute intracranial abnormality. Electronically Signed   By: Aram Candela M.D.   On: 09/26/2022 15:11    Procedures Procedures    Medications Ordered in ED Medications  sodium chloride 0.9 % bolus 1,000 mL (has no administration in time range)    ED Course/ Medical Decision Making/ A&P                                 Medical Decision Making Amount and/or Complexity of Data Reviewed Labs: ordered. Radiology: ordered.  Risk Decision regarding hospitalization.   82 year old female presenting after near syncopal symptoms, headache, and feelings of  lightheadedness.  On exam patient is alert and oriented x 3, GCS of 15, moving all 4 extremities spontaneously.  No neurovascular deficits.  CT head demonstrates no acute bleeds.  ECG concerning for prolonged PR with bradycardia.  Troponin stable.  Electrolytes stable.  No gross dehydration.  Small bump in creatinine from 1.16 in May to 1.3 today.  The fluids given.  UA negative for UTI.  No infectious signs or symptoms.  No sepsis.    When the patient did have near syncope she did not lose consciousness.  She did not fall or have any trauma.  Patient recommended for admission for symptomatic bradycardia.  Cardiology consulted.  Waiting on back from hospitalist team.   Cardiologist on call Dr. Coral Else recommends stopping verapamil.    Admitting team with [facility].  Final Clinical Impression(s) / ED Diagnoses Final diagnoses:  Bradycardia  Near syncope    Rx / DC Orders ED Discharge Orders     None         Franne Forts, DO 09/26/22 1551    Edwin Dada P, DO 09/26/22 1602    Edwin Dada P, DO 09/26/22 1611

## 2022-09-26 NOTE — H&P (Signed)
[facility] History and Physical   Patient: Kathryn Haley JXB:[id number]   PCP: Farris Has, MD DOB: 1940-09-19   DOA: 09/26/2022   DOS: 09/26/2022   DOS: the patient was seen and examined on 09/26/2022  Patient coming from: The patient is coming from Home  Chief Complaint: Presyncope, headache, dizziness  HPI: Kathryn Haley is a 82 y.o. female with Past medical history of HTN, HLD, breast cancer, as reviewed from EMR, presented at [facility] ED with complaining of feeling lightheaded, dizzy and passing out, had headache also.  Patient was seen in [facility] ED, found to have bradycardia, heart rate was in 40s.  Patient was seen by cardiologist, recommended to discontinue verapamil and admit for overnight observation.   ED Course: Vital signs HR 42--55, BP stable, saturating well on room air BMP, glucose 152 slightly elevated, creatinine 1.3 elevated, rest within normal range Troponin x 2 negative, CBC Hb 11.2 slightly low, TSH 1.8 within normal range UA negative CT head:1. Generalized cerebral atrophy with chronic white matter small vessel ischemic changes. 2. No acute intracranial abnormality.   Review of Systems: as mentioned in the history of present illness.  All other systems reviewed and are negative.  Past Medical History:  Diagnosis Date   Anxiety    Arthritis    Breast cancer (HCC)    Breast cancer of upper-outer quadrant of left female breast (HCC) 04/11/2014   Family history of breast cancer    Heart murmur    echo 2013-mild regurg-aortic,mitral,tricu   Hypercholesteremia    Hypertension    Urinary frequency    Past Surgical History:  Procedure Laterality Date   BREAST EXCISIONAL BIOPSY     BREAST LUMPECTOMY     left 2016   BREAST LUMPECTOMY WITH NEEDLE LOCALIZATION AND AXILLARY SENTINEL LYMPH NODE BX Left 05/09/2014   Procedure: BRACKETED WIRE LOCALIZED LEFT BREAST LUMPECTOMY WITH LEFT  AXILLARY SENTINEL LYMPH NODE BX;  Surgeon: Glenna Fellows, MD;   Location: [facility];  Service: General;  Laterality: Left;   BREAST SURGERY     lt breast bx x3-negative   COLONOSCOPY     EYE SURGERY     both cataracts   TONSILLECTOMY     Social History:  reports that she has never smoked. She has been exposed to tobacco smoke. She has never used smokeless tobacco. She reports that she does not drink alcohol and does not use drugs.  Allergies  Allergen Reactions   Ciprofloxacin Shortness Of Breath   Decadron [Dexamethasone] Anaphylaxis, Swelling and Rash    Swelling of lips and tongue   Other Anaphylaxis    Patient had antibiotic shot in a MD office , does not know what name of drug is.   Penicillins Nausea And Vomiting    VOMITTING   Sulfa Antibiotics Swelling   Keflex [Cephalexin] Rash    SKIN RASH   Toviaz [Fesoterodine Fumarate Er] Rash and Other (See Comments)    DIZZINESS    Family history reviewed and not pertinent Family History  Problem Relation Age of Onset   Brain cancer Sister    Breast cancer Daughter    Breast cancer Daughter        youngest daughter possibly had genetic testing     Prior to Admission medications   Medication Sig Start Date End Date Taking? Authorizing Provider  amLODipine (NORVASC) 5 MG tablet Take 5 mg by mouth at bedtime.    Yes [provider]  cyanocobalamin 100 MCG tablet Take  100 mcg by mouth daily.   Yes [provider]  furosemide (LASIX) 20 MG tablet Take 0.5 tablets (10 mg total) by mouth daily for 4 days. 06/11/22 09/26/22 Yes Mardene Sayer, MD  rosuvastatin (CRESTOR) 10 MG tablet Take 10 mg by mouth every evening. Pt takes Brand name of this medication   Yes [provider]  triamcinolone cream (KENALOG) 0.1 % Apply 1 application topically 2 (two) times daily.   Yes [provider]  verapamil (VERELAN PM) 240 MG 24 hr capsule Take 240 mg by mouth every morning.    Yes [provider]  cholecalciferol (VITAMIN D) 1000 UNITS tablet  Take 1 tablet (1,000 Units total) by mouth daily. 06/08/14   Magrinat, Valentino Hue, MD  mirtazapine (REMERON) 15 MG tablet Take 1 tablet (15 mg total) by mouth at bedtime. Patient not taking: Reported on 09/26/2022 11/20/17   Lowella Dell, MD    Physical Exam: Vitals:   09/26/22 1701 09/26/22 1741 09/26/22 1744 09/26/22 1945  BP:   (!) 176/83 (!) 142/67  Pulse:    (!) 59  Resp:   17 15  Temp: 98.3 F (36.8 C)  97.9 F (36.6 C) 98.4 F (36.9 C)  TempSrc: Oral  Oral Oral  SpO2:    100%  Weight:  51.1 kg    Height:        General: alert and oriented to time, place, and person. Appear in no distress, affect appropriate Eyes: PERRLA, Conjunctiva normal ENT: Oral Mucosa Clear, moist  Neck: no JVD, no Abnormal Mass Or lumps Cardiovascular: S1 and S2 Present, no Murmur, peripheral pulses symmetrical Respiratory: good respiratory effort, Bilateral Air entry equal and Decreased, no signs of accessory muscle use, Clear to Auscultation, no Crackles, no wheezes Abdomen: Bowel Sound present, Soft and no tenderness, no hernia Skin: no rashes  Extremities: no Pedal edema, no calf tenderness Neurologic: without any new focal findings Gait not checked due to patient safety concerns  Data Reviewed: I have personally reviewed and interpreted labs, imaging as discussed below.  CBC: Recent Labs  Lab 09/26/22 1300  WBC 6.8  HGB 11.2*  HCT 34.5*  MCV 87.1  PLT 314   Basic Metabolic Panel: Recent Labs  Lab 09/26/22 1300 09/26/22 1813  NA 140  --   K 3.7  --   CL 107  --   CO2 25  --   GLUCOSE 152*  --   BUN 20  --   CREATININE 1.30*  --   CALCIUM 9.3  --   MG  --  2.1   GFR: Estimated Creatinine Clearance: 26.9 mL/min (A) (by C-G formula based on SCr of 1.3 mg/dL (H)). Liver Function Tests: No results for input(s): "AST", "ALT", "ALKPHOS", "BILITOT", "PROT", "ALBUMIN" in the last 168 hours. Recent Labs  Lab 09/26/22 1300  LIPASE 18   No results for input(s): "AMMONIA" in  the last 168 hours. Coagulation Profile: No results for input(s): "INR", "PROTIME" in the last 168 hours. Cardiac Enzymes: No results for input(s): "CKTOTAL", "CKMB", "CKMBINDEX", "TROPONINI" in the last 168 hours. BNP (last 3 results) No results for input(s): "PROBNP" in the last 8760 hours. HbA1C: No results for input(s): "HGBA1C" in the last 72 hours. CBG: Recent Labs  Lab 09/26/22 1256  GLUCAP 109*   Lipid Profile: No results for input(s): "CHOL", "HDL", "LDLCALC", "TRIG", "CHOLHDL", "LDLDIRECT" in the last 72 hours. Thyroid Function Tests: Recent Labs    09/26/22 1813  TSH 1.877   Anemia  Panel: No results for input(s): "VITAMINB12", "FOLATE", "FERRITIN", "TIBC", "IRON", "RETICCTPCT" in the last 72 hours. Urine analysis:    Component Value Date/Time   COLORURINE YELLOW 09/26/2022 1427   APPEARANCEUR HAZY (A) 09/26/2022 1427   LABSPEC 1.023 09/26/2022 1427   PHURINE 5.5 09/26/2022 1427   GLUCOSEU NEGATIVE 09/26/2022 1427   HGBUR NEGATIVE 09/26/2022 1427   BILIRUBINUR NEGATIVE 09/26/2022 1427   KETONESUR NEGATIVE 09/26/2022 1427   PROTEINUR 30 (A) 09/26/2022 1427   UROBILINOGEN 0.2 05/27/2011 1706   NITRITE NEGATIVE 09/26/2022 1427   LEUKOCYTESUR TRACE (A) 09/26/2022 1427    Radiological Exams on Admission: CT Head Wo Contrast  Result Date: 09/26/2022 CLINICAL DATA:  Vomiting. EXAM: CT HEAD WITHOUT CONTRAST TECHNIQUE: Contiguous axial images were obtained from the base of the skull through the vertex without intravenous contrast. RADIATION DOSE REDUCTION: This exam was performed according to the departmental dose-optimization program which includes automated exposure control, adjustment of the mA and/or kV according to patient size and/or use of iterative reconstruction technique. COMPARISON:  May 27, 2011 FINDINGS: Brain: There is mild cerebral atrophy with widening of the extra-axial spaces and ventricular dilatation. There are areas of decreased attenuation within the  white matter tracts of the supratentorial brain, consistent with microvascular disease changes. Vascular: There is marked severity bilateral cavernous carotid artery calcification. Skull: Normal. Negative for fracture or focal lesion. Sinuses/Orbits: No acute finding. Other: None. IMPRESSION: 1. Generalized cerebral atrophy with chronic white matter small vessel ischemic changes. 2. No acute intracranial abnormality. Electronically Signed   By: Aram Candela M.D.   On: 09/26/2022 15:11    EKG: Independently reviewed. sinus bradycardia.  Nonspecific ST and T wave changes  Echocardiogram: pending  I reviewed all nursing notes, pharmacy notes, vitals, pertinent old records.  Assessment/Plan Principal Problem:   Bradycardia Active Problems:   Symptomatic bradycardia    Symptomatic bradycardia Presented with heart rate 42---53, feeling dizzy, lightheaded and headache Symptoms resolved Continue to monitor on telemetry Follow 2D echocardiogram Discontinued verapamil Avoid AV nodal blocker Patient was at [facility] ED was seen by cardiologist Dr. Flora Lipps, recommended to observe overnight and discontinue verapamil. Follow cardiology for further recommendation   Hypertension Resumed home meds amlodipine and losartan Monitor BP and titrate medication accordingly  Prior history of breast cancer, recommend to follow with PCP as an outpatient Patient is also complaining of weight loss Recommend to follow as an outpatient for further workup   Nutrition: Regular diet DVT Prophylaxis: Subcutaneous Lovenox  Advance goals of care discussion: Full code   Consults: seen by Cardio at [facility]    Family Communication: family was not present at bedside, at the time of interview.  Opportunity was given to ask question and all questions were answered satisfactorily.  Disposition: Admitted as observation, telemetry unit. Likely to be discharged home, in 1-2 days.  I have discussed plan of care as  described above with RN and patient/family.  Severity of Illness: The appropriate patient status for this patient is OBSERVATION. Observation status is judged to be reasonable and necessary in order to provide the required intensity of service to ensure the patient's safety. The patient's presenting symptoms, physical exam findings, and initial radiographic and laboratory data in the context of their medical condition is felt to place them at decreased risk for further clinical deterioration. Furthermore, it is anticipated that the patient will be medically stable for discharge from the hospital within 2 midnights of admission.    Author: Gillis Santa, MD [facility] 09/26/2022 7:47 PM  To reach On-call, see care teams to locate the attending and reach out to them via [email or web address]. If 7PM-7AM, please contact night-coverage If you still have difficulty reaching the attending provider, please page the [facility] (Director on Call) for [facility] on amion for assistance.

## 2022-09-27 ENCOUNTER — Observation Stay (HOSPITAL_BASED_OUTPATIENT_CLINIC_OR_DEPARTMENT_OTHER): Payer: Medicare Other

## 2022-09-27 DIAGNOSIS — R55 Syncope and collapse: Secondary | ICD-10-CM | POA: Diagnosis not present

## 2022-09-27 DIAGNOSIS — I361 Nonrheumatic tricuspid (valve) insufficiency: Secondary | ICD-10-CM

## 2022-09-27 DIAGNOSIS — I10 Essential (primary) hypertension: Secondary | ICD-10-CM | POA: Diagnosis not present

## 2022-09-27 DIAGNOSIS — R001 Bradycardia, unspecified: Secondary | ICD-10-CM | POA: Diagnosis not present

## 2022-09-27 LAB — MAGNESIUM: Magnesium: 2.1 mg/dL (ref 1.7–2.4)

## 2022-09-27 LAB — ECHOCARDIOGRAM COMPLETE
AR max vel: 1.51 cm2
AV Area VTI: 1.48 cm2
AV Area mean vel: 1.43 cm2
AV Mean grad: 11.7 mmHg
AV Peak grad: 21.7 mmHg
Ao pk vel: 2.33 m/s
Area-P 1/2: 2.73 cm2
Height: 63 in
MV M vel: 2.4 m/s
MV Peak grad: 23 mmHg
P 1/2 time: 589 ms
S' Lateral: 2.7 cm
Weight: 1802.48 [oz_av]

## 2022-09-27 LAB — CBC
HCT: 31 % — ABNORMAL LOW (ref 36.0–46.0)
Hemoglobin: 9.9 g/dL — ABNORMAL LOW (ref 12.0–15.0)
MCH: 28.6 pg (ref 26.0–34.0)
MCHC: 31.9 g/dL (ref 30.0–36.0)
MCV: 89.6 fL (ref 80.0–100.0)
Platelets: 249 10*3/uL (ref 150–400)
RBC: 3.46 MIL/uL — ABNORMAL LOW (ref 3.87–5.11)
RDW: 13.2 % (ref 11.5–15.5)
WBC: 4.7 10*3/uL (ref 4.0–10.5)
nRBC: 0 % (ref 0.0–0.2)

## 2022-09-27 LAB — BASIC METABOLIC PANEL
Anion gap: 6 (ref 5–15)
BUN: 15 mg/dL (ref 8–23)
CO2: 26 mmol/L (ref 22–32)
Calcium: 9.2 mg/dL (ref 8.9–10.3)
Chloride: 109 mmol/L (ref 98–111)
Creatinine, Ser: 1.02 mg/dL — ABNORMAL HIGH (ref 0.44–1.00)
GFR, Estimated: 55 mL/min — ABNORMAL LOW (ref >60)
Glucose, Bld: 92 mg/dL (ref 70–99)
Potassium: 4.2 mmol/L (ref 3.5–5.1)
Sodium: 141 mmol/L (ref 135–145)

## 2022-09-27 LAB — PHOSPHORUS: Phosphorus: 3.1 mg/dL (ref 2.5–4.6)

## 2022-09-27 MED ORDER — ENOXAPARIN SODIUM 40 MG/0.4ML IJ SOSY: 40.0000 mg | PREFILLED_SYRINGE | INTRAMUSCULAR | Status: DC

## 2022-09-27 MED ORDER — LOSARTAN POTASSIUM 25 MG PO TABS: 25.0000 mg | ORAL_TABLET | Freq: Every day | ORAL | 0 refills | Status: AC

## 2022-09-27 MED ORDER — AMLODIPINE BESYLATE 10 MG PO TABS: 10.0000 mg | ORAL_TABLET | Freq: Every day | ORAL | 0 refills | Status: DC

## 2022-09-27 MED ORDER — AMLODIPINE BESYLATE 10 MG PO TABS: 10.0000 mg | ORAL_TABLET | Freq: Every day | ORAL | 0 refills | Status: AC

## 2022-09-27 MED ORDER — LOSARTAN POTASSIUM 25 MG PO TABS: 25.0000 mg | ORAL_TABLET | Freq: Every day | ORAL | 0 refills | Status: DC

## 2022-09-27 NOTE — Progress Notes (Signed)
Cardiology Progress Note  Patient ID: LORING FREYTES MRN: [id number] DOB: August 23, 1940 Date of Encounter: 09/27/2022  Primary Cardiologist: Reatha Harps, MD  Subjective   Chief Complaint: none.   HPI: Denies chest pain or trouble breathing.  No further dizziness or lightheadedness.  ROS:  All other ROS reviewed and negative. Pertinent positives noted in the HPI.     Inpatient Medications  Scheduled Meds:  amLODipine  10 mg Oral Daily   enoxaparin (LOVENOX) injection  40 mg Subcutaneous Q24H   losartan  25 mg Oral Daily   sodium chloride flush  3 mL Intravenous Q12H   Continuous Infusions:  sodium chloride     PRN Meds: sodium chloride, acetaminophen **OR** acetaminophen, hydrALAZINE, ondansetron **OR** ondansetron (ZOFRAN) IV, polyethylene glycol, sodium chloride flush   Vital Signs   Vitals:   09/26/22 1945 09/27/22 0041 09/27/22 0436 09/27/22 0735  BP: (!) 142/67 (!) 151/88 (!) 156/76   Pulse: (!) 59 (!) 58 60   Resp: 15 16 18 18   Temp: 98.4 F (36.9 C) 97.9 F (36.6 C) 97.7 F (36.5 C) 98.2 F (36.8 C)  TempSrc: Oral Oral Oral Oral  SpO2: 100% 100% 99%   Weight:      Height:        Intake/Output Summary (Last 24 hours) at 09/27/2022 0905 Last data filed at 09/27/2022 0437 Gross per 24 hour  Intake 395 ml  Output 380 ml  Net 15 ml      09/26/2022    5:41 PM 09/26/2022   12:42 PM 06/10/2022    6:43 PM  Last 3 Weights  Weight (lbs) 112 lb 10.5 oz 116 lb 117 lb  Weight (kg) 51.1 kg 52.617 kg 53.071 kg      Telemetry  Overnight telemetry shows sinus rhythm 70s, which I personally reviewed.    Physical Exam   Vitals:   09/26/22 1945 09/27/22 0041 09/27/22 0436 09/27/22 0735  BP: (!) 142/67 (!) 151/88 (!) 156/76   Pulse: (!) 59 (!) 58 60   Resp: 15 16 18 18   Temp: 98.4 F (36.9 C) 97.9 F (36.6 C) 97.7 F (36.5 C) 98.2 F (36.8 C)  TempSrc: Oral Oral Oral Oral  SpO2: 100% 100% 99%   Weight:      Height:        Intake/Output Summary (Last 24 hours)  at 09/27/2022 0905 Last data filed at 09/27/2022 0437 Gross per 24 hour  Intake 395 ml  Output 380 ml  Net 15 ml       09/26/2022    5:41 PM 09/26/2022   12:42 PM 06/10/2022    6:43 PM  Last 3 Weights  Weight (lbs) 112 lb 10.5 oz 116 lb 117 lb  Weight (kg) 51.1 kg 52.617 kg 53.071 kg    Body mass index is 19.96 kg/m.  General: Well nourished, well developed, in no acute distress Head: Atraumatic, normal size  Eyes: PEERLA, EOMI  Neck: Supple, no JVD Endocrine: No thryomegaly Cardiac: Normal S1, S2; RRR; 2 out of 6 holosystolic murmur Lungs: Clear to auscultation bilaterally, no wheezing, rhonchi or rales  Abd: Soft, nontender, no hepatomegaly  Ext: No edema, pulses 2+ Musculoskeletal: No deformities, BUE and BLE strength normal and equal Skin: Warm and dry, no rashes   Neuro: Alert and oriented to person, place, time, and situation, CNII-XII grossly intact, no focal deficits  Psych: Normal mood and affect   Labs  High Sensitivity Troponin:   Recent Labs  Lab 09/26/22 1300 09/26/22  1531  TROPONINIHS 16 16     Cardiac EnzymesNo results for input(s): "TROPONINI" in the last 168 hours. No results for input(s): "TROPIPOC" in the last 168 hours.  Chemistry Recent Labs  Lab 09/26/22 1300 09/27/22 0550  NA 140 141  K 3.7 4.2  CL 107 109  CO2 25 26  GLUCOSE 152* 92  BUN 20 15  CREATININE 1.30* 1.02*  CALCIUM 9.3 9.2  GFRNONAA 41* 55*  ANIONGAP 8 6    Hematology Recent Labs  Lab 09/26/22 1300 09/27/22 0550  WBC 6.8 4.7  RBC 3.96 3.46*  HGB 11.2* 9.9*  HCT 34.5* 31.0*  MCV 87.1 89.6  MCH 28.3 28.6  MCHC 32.5 31.9  RDW 13.2 13.2  PLT 314 249   BNPNo results for input(s): "BNP", "PROBNP" in the last 168 hours.  DDimer No results for input(s): "DDIMER" in the last 168 hours.   Radiology  CT Head Wo Contrast  Result Date: 09/26/2022 CLINICAL DATA:  Vomiting. EXAM: CT HEAD WITHOUT CONTRAST TECHNIQUE: Contiguous axial images were obtained from the base of the skull  through the vertex without intravenous contrast. RADIATION DOSE REDUCTION: This exam was performed according to the departmental dose-optimization program which includes automated exposure control, adjustment of the mA and/or kV according to patient size and/or use of iterative reconstruction technique. COMPARISON:  May 27, 2011 FINDINGS: Brain: There is mild cerebral atrophy with widening of the extra-axial spaces and ventricular dilatation. There are areas of decreased attenuation within the white matter tracts of the supratentorial brain, consistent with microvascular disease changes. Vascular: There is marked severity bilateral cavernous carotid artery calcification. Skull: Normal. Negative for fracture or focal lesion. Sinuses/Orbits: No acute finding. Other: None. IMPRESSION: 1. Generalized cerebral atrophy with chronic white matter small vessel ischemic changes. 2. No acute intracranial abnormality. Electronically Signed   By: Aram Candela M.D.   On: 09/26/2022 15:11    Cardiac Studies  Echo pending   Patient Profile  ROSENDA HORWEDEL is a 82 y.o. female with hypertension, hyperlipidemia, breast cancer admitted on 09/26/2022 for presyncope and bradycardia.  Assessment & Plan   # Dizziness # Presyncope # Sinus bradycardia -Admitted with presyncopal symptoms after not eating or drinking much yesterday.  Symptoms resolved in seconds.  Evaluate at the [facility] and noted to have sinus bradycardia heart rate 40s to 50s which was related to verapamil. -No indications for pacing.  Heart rate is improved off verapamil. -Thyroid study normal.  No symptoms of infection. -Overall suspect her symptoms were combination of dehydration and being on verapamil.  We have stopped her verapamil.  Would recommend to hold this moving forward.  No strong indication.  I have encouraged her to drink more water.  She does not drink enough on her report. -EKG shows narrow intervals. -Echo pending.  Anticipate  discharge as long as this is within limits.  # Hypertension -Continue amlodipine.  We stopped her verapamil.  Continue losartan.  This can be titrated up for better blood pressure control.  # Murmur -Mild MR/TR several years ago.  Repeat echo pending.  [facility] will sign off.   Medication Recommendations: As above Other recommendations (labs, testing, etc): None Follow up as an outpatient: We will arrange outpatient follow-up in 2 to 4 weeks  For questions or updates, please contact [facility] Please consult [email or web address] for contact info under        Signed, Gerri Spore T. Flora Lipps, MD, [facility] [place]  [facility]  09/27/2022 9:05  AM

## 2022-09-27 NOTE — Evaluation (Signed)
Physical Therapy Evaluation Patient Details Name: Kathryn Haley MRN: [id number] DOB: 29-Mar-1940 Today's Date: 09/27/2022  History of Present Illness  Pt is 82 year old presented [facility] on  09/26/22 for presyncope and dizziness. Pt with bradycardia likely due to verapamil and that medication was discontinued PMH - arthritis, breast CA, HTN  Clinical Impression  Pt doing well with mobility and no further PT needed.  Ready for dc from PT standpoint.         If plan is discharge home, recommend the following:     Can travel by private vehicle        Equipment Recommendations None recommended by PT  Recommendations for Other Services       Functional Status Assessment Patient has not had a recent decline in their functional status     Precautions / Restrictions Precautions Precautions: Fall Restrictions Weight Bearing Restrictions: No      Mobility  Bed Mobility Overal bed mobility: Modified Independent                  Transfers Overall transfer level: Modified independent Equipment used: None                    Ambulation/Gait Ambulation/Gait assistance: Supervision Gait Distance (Feet): 200 Feet Assistive device: Rolling walker (2 wheels), None Gait Pattern/deviations: Step-through pattern, Decreased stride length, Trunk flexed Gait velocity: decr Gait velocity interpretation: 1.31 - 2.62 ft/sec, indicative of limited community ambulator   General Gait Details: Supervision for safety and verbal cues to stay closer to walker  Stairs            Wheelchair Mobility     Tilt Bed    Modified Rankin (Stroke Patients Only)       Balance Overall balance assessment: Needs assistance Sitting-balance support: No upper extremity supported, Feet supported Sitting balance-Leahy Scale: Good     Standing balance support: No upper extremity supported Standing balance-Leahy Scale: Fair                               Pertinent  Vitals/Pain Pain Assessment Pain Assessment: No/denies pain    Home Living Family/patient expects to be discharged to:: Private residence Living Arrangements: Alone Available Help at Discharge: Friend(s);Available PRN/intermittently Type of Home: Apartment Home Access: Level entry       Home Layout: One level Home Equipment: Agricultural consultant (2 wheels);Cane - single point      Prior Function Prior Level of Function : Independent/Modified Independent;Driving             Mobility Comments: Uses cane or rolling walker at times       Extremity/Trunk Assessment   Upper Extremity Assessment Upper Extremity Assessment: Overall WFL for tasks assessed    Lower Extremity Assessment Lower Extremity Assessment: Overall WFL for tasks assessed    Cervical / Trunk Assessment Cervical / Trunk Assessment: Kyphotic  Communication   Communication Communication: No apparent difficulties  Cognition Arousal: Alert Behavior During Therapy: WFL for tasks assessed/performed Overall Cognitive Status: Within Functional Limits for tasks assessed                                          General Comments General comments (skin integrity, edema, etc.): RHR late 50's, HR after amb 90    Exercises  Assessment/Plan    PT Assessment Patient does not need any further PT services  PT Problem List         PT Treatment Interventions      PT Goals (Current goals can be found in the Care Plan section)  Acute Rehab PT Goals PT Goal Formulation: All assessment and education complete, DC therapy    Frequency       Co-evaluation               AM-PAC PT "6 Clicks" Mobility  Outcome Measure Help needed turning from your back to your side while in a flat bed without using bedrails?: None Help needed moving from lying on your back to sitting on the side of a flat bed without using bedrails?: None Help needed moving to and from a bed to a chair (including a  wheelchair)?: None Help needed standing up from a chair using your arms (e.g., wheelchair or bedside chair)?: None Help needed to walk in hospital room?: None Help needed climbing 3-5 steps with a railing? : A Little 6 Click Score: 23    End of Session   Activity Tolerance: Patient tolerated treatment well Patient left: in bed;with call bell/phone within reach   PT Visit Diagnosis: Other abnormalities of gait and mobility (R26.89)    Time: 7829-5621 PT Time Calculation (min) (ACUTE ONLY): 10 min   Charges:   PT Evaluation $PT Eval Low Complexity: 1 Low   PT General Charges $$ ACUTE PT VISIT: 1 Visit         [facility] PT Acute Rehabilitation Services Office [phone]   Angelina Ok [facility] 09/27/2022, 10:26 AM

## 2022-09-27 NOTE — Discharge Summary (Signed)
Physician Discharge Summary  Kathryn Haley UVO:[id number] DOB: [birth year] DOA: 09/26/2022  PCP: Farris Has, MD  Admit date: 09/26/2022 Discharge date: 09/27/2022  Admitted From: Home Disposition: Home  Recommendations for Outpatient Follow-up:  Follow up with PCP in 1 week with repeat CBC/BMP Outpatient follow-up with cardiology Follow up in ED if symptoms worsen or new appear   Home Health: No Equipment/Devices: None  Discharge Condition: Stable CODE STATUS: Full Diet recommendation: Heart healthy  Brief/Interim Summary: 82 year old female with past medical history of hypertension, hyperlipidemia, breast cancer presented with presyncope, headache and dizziness.  On presentation, she was found to have bradycardia with heart rate in the 40s.  Cardiology recommended to discontinue ramipril and admit for overnight observation.  During the hospitalization, her heart rate has improved slightly.  Patient denies any more dizziness and wants to go home today.  Cardiology has cleared the patient for discharge.  She will be discharged home today after echocardiogram with outpatient follow-up with PCP and cardiology.  Discharge Diagnoses:   Dizziness/presyncope/sinus bradycardia -Heart rate in the 40s on presentation.  Off verapamil as per cardiology recommendations.  Heart rate improving but still intermittently mildly bradycardic. Cardiology has cleared the patient for discharge.  She will be discharged home today after echocardiogram with outpatient follow-up with PCP and cardiology.  Her symptoms have improved and she has tolerated PT eval.  Hypertension -Off verapamil.  Continue losartan and increase dose of amlodipine as per cardiology recommendations.  History of breast cancer -Outpatient follow-up with PCP/oncology  Weight loss Dysphagia -Outpatient follow-up with PCP.  Might need outpatient GI evaluation.  Discharge Instructions  Discharge Instructions     Ambulatory referral to  Cardiology   Complete by: As directed    Diet - low sodium heart healthy   Complete by: As directed    Increase activity slowly   Complete by: As directed       Allergies as of 09/27/2022       Reactions   Ciprofloxacin Shortness Of Breath   Decadron [dexamethasone] Anaphylaxis, Swelling, Rash   Swelling of lips and tongue   Other Anaphylaxis   Patient had antibiotic shot in a MD office , does not know what name of drug is.   Penicillins Nausea And Vomiting   VOMITTING   Sulfa Antibiotics Swelling   Keflex [cephalexin] Rash   SKIN RASH   Toviaz [fesoterodine Fumarate Er] Rash, Other (See Comments)   DIZZINESS        Medication List     STOP taking these medications    furosemide 20 MG tablet Commonly known as: LASIX   mirtazapine 15 MG tablet Commonly known as: Remeron   verapamil 240 MG 24 hr capsule Commonly known as: VERELAN       TAKE these medications    amLODipine 10 MG tablet Commonly known as: NORVASC Take 1 tablet (10 mg total) by mouth daily. Start taking on: September 28, 2022 What changed:  medication strength how much to take when to take this   cholecalciferol 1000 units tablet Commonly known as: VITAMIN D Take 1 tablet (1,000 Units total) by mouth daily.   cyanocobalamin 100 MCG tablet Take 100 mcg by mouth daily.   losartan 25 MG tablet Commonly known as: COZAAR Take 1 tablet (25 mg total) by mouth daily. Start taking on: September 28, 2022   rosuvastatin 10 MG tablet Commonly known as: CRESTOR Take 10 mg by mouth every evening. Pt takes Brand name of this medication   triamcinolone cream  0.1 % Commonly known as: KENALOG Apply 1 application topically 2 (two) times daily.        Follow-up Information     Farris Has, MD Follow up.   Specialty: Family Medicine Contact information: [address] [place] [address] [phone]         Farris Has, MD. Schedule an appointment as soon as  possible for a visit in 1 week(s).   Specialty: Family Medicine Contact information: [address] [place] [address] [phone]                Allergies  Allergen Reactions   Ciprofloxacin Shortness Of Breath   Decadron [Dexamethasone] Anaphylaxis, Swelling and Rash    Swelling of lips and tongue   Other Anaphylaxis    Patient had antibiotic shot in a [place] office , does not know what name of drug is.   Penicillins Nausea And Vomiting    VOMITTING   Sulfa Antibiotics Swelling   Keflex [Cephalexin] Rash    SKIN RASH   Toviaz [Fesoterodine Fumarate Er] Rash and Other (See Comments)    DIZZINESS    Consultations: Cardiology   Procedures/Studies: CT Head Wo Contrast  Result Date: 09/26/2022 CLINICAL DATA:  Vomiting. EXAM: CT HEAD WITHOUT CONTRAST TECHNIQUE: Contiguous axial images were obtained from the base of the skull through the vertex without intravenous contrast. RADIATION DOSE REDUCTION: This exam was performed according to the departmental dose-optimization program which includes automated exposure control, adjustment of the mA and/or kV according to patient size and/or use of iterative reconstruction technique. COMPARISON:  May 27, 2011 FINDINGS: Brain: There is mild cerebral atrophy with widening of the extra-axial spaces and ventricular dilatation. There are areas of decreased attenuation within the white matter tracts of the supratentorial brain, consistent with microvascular disease changes. Vascular: There is marked severity bilateral cavernous carotid artery calcification. Skull: Normal. Negative for fracture or focal lesion. Sinuses/Orbits: No acute finding. Other: None. IMPRESSION: 1. Generalized cerebral atrophy with chronic white matter small vessel ischemic changes. 2. No acute intracranial abnormality. Electronically Signed   By: Aram Candela M.D.   On: 09/26/2022 15:11      Subjective: Patient seen and examined at bedside.  Feels  much better and wants to go home today.  Denies any current dizziness, nausea or vomiting.  Discharge Exam: Vitals:   09/27/22 0436 09/27/22 0735  BP: (!) 156/76   Pulse: 60   Resp: 18 18  Temp: 97.7 F (36.5 C) 98.2 F (36.8 C)  SpO2: 99%     General: Pt is alert, awake, not in acute distress.  On room air. Cardiovascular: rate controlled currently, S1/S2 + Respiratory: bilateral decreased breath sounds at bases Abdominal: Soft, NT, ND, bowel sounds + Extremities: no edema, no cyanosis    The results of significant diagnostics from this hospitalization (including imaging, microbiology, ancillary and laboratory) are listed below for reference.     Microbiology: No results found for this or any previous visit (from the past 240 hour(s)).   Labs: BNP (last 3 results) Recent Labs    06/10/22 1846  BNP 177.8*   Basic Metabolic Panel: Recent Labs  Lab 09/26/22 1300 09/26/22 1813 09/27/22 0550  NA 140  --  141  K 3.7  --  4.2  CL 107  --  109  CO2 25  --  26  GLUCOSE 152*  --  92  BUN 20  --  15  CREATININE 1.30*  --  1.02*  CALCIUM 9.3  --  9.2  MG  --  2.1 2.1  PHOS  --   --  3.1   Liver Function Tests: No results for input(s): "AST", "ALT", "ALKPHOS", "BILITOT", "PROT", "ALBUMIN" in the last 168 hours. Recent Labs  Lab 09/26/22 1300  LIPASE 18   No results for input(s): "AMMONIA" in the last 168 hours. CBC: Recent Labs  Lab 09/26/22 1300 09/27/22 0550  WBC 6.8 4.7  HGB 11.2* 9.9*  HCT 34.5* 31.0*  MCV 87.1 89.6  PLT 314 249   Cardiac Enzymes: No results for input(s): "CKTOTAL", "CKMB", "CKMBINDEX", "TROPONINI" in the last 168 hours. BNP: Invalid input(s): "POCBNP" CBG: Recent Labs  Lab 09/26/22 1256  GLUCAP 109*   D-Dimer No results for input(s): "DDIMER" in the last 72 hours. Hgb A1c No results for input(s): "HGBA1C" in the last 72 hours. Lipid Profile No results for input(s): "CHOL", "HDL", "LDLCALC", "TRIG", "CHOLHDL", "LDLDIRECT"  in the last 72 hours. Thyroid function studies Recent Labs    09/26/22 1813  TSH 1.877   Anemia work up No results for input(s): "VITAMINB12", "FOLATE", "FERRITIN", "TIBC", "IRON", "RETICCTPCT" in the last 72 hours. Urinalysis    Component Value Date/Time   COLORURINE YELLOW 09/26/2022 1427   APPEARANCEUR HAZY (A) 09/26/2022 1427   LABSPEC 1.023 09/26/2022 1427   PHURINE 5.5 09/26/2022 1427   GLUCOSEU NEGATIVE 09/26/2022 1427   HGBUR NEGATIVE 09/26/2022 1427   BILIRUBINUR NEGATIVE 09/26/2022 1427   KETONESUR NEGATIVE 09/26/2022 1427   PROTEINUR 30 (A) 09/26/2022 1427   UROBILINOGEN 0.2 05/27/2011 1706   NITRITE NEGATIVE 09/26/2022 1427   LEUKOCYTESUR TRACE (A) 09/26/2022 1427   Sepsis Labs Recent Labs  Lab 09/26/22 1300 09/27/22 0550  WBC 6.8 4.7   Microbiology No results found for this or any previous visit (from the past 240 hour(s)).   Time coordinating discharge: 35 minutes  SIGNED:   Glade Lloyd, MD  [facility] 09/27/2022, 11:03 AM

## 2022-09-27 NOTE — Progress Notes (Signed)
Echocardiogram 2D Echocardiogram has been performed.  Kathryn Haley 09/27/2022, 11:42 AM

## 2022-09-27 NOTE — Plan of Care (Signed)

## 2022-09-27 NOTE — Plan of Care (Signed)
  Problem: Education: Goal: Knowledge of General Education information will improve Description: Including pain rating scale, medication(s)/side effects and non-pharmacologic comfort measures 09/27/2022 1105 by Herma Carson, RN Outcome: Adequate for Discharge 09/27/2022 0744 by Herma Carson, RN Outcome: Progressing   Problem: Health Behavior/Discharge Planning: Goal: Ability to manage health-related needs will improve 09/27/2022 1105 by Herma Carson, RN Outcome: Adequate for Discharge 09/27/2022 0744 by Herma Carson, RN Outcome: Progressing   Problem: Clinical Measurements: Goal: Ability to maintain clinical measurements within normal limits will improve 09/27/2022 1105 by Herma Carson, RN Outcome: Adequate for Discharge 09/27/2022 0744 by Herma Carson, RN Outcome: Progressing Goal: Will remain free from infection 09/27/2022 1105 by Herma Carson, RN Outcome: Adequate for Discharge 09/27/2022 0744 by Herma Carson, RN Outcome: Progressing Goal: Diagnostic test results will improve 09/27/2022 1105 by Herma Carson, RN Outcome: Adequate for Discharge 09/27/2022 0744 by Herma Carson, RN Outcome: Progressing Goal: Respiratory complications will improve 09/27/2022 1105 by Herma Carson, RN Outcome: Adequate for Discharge 09/27/2022 0744 by Herma Carson, RN Outcome: Progressing Goal: Cardiovascular complication will be avoided 09/27/2022 1105 by Herma Carson, RN Outcome: Adequate for Discharge 09/27/2022 0744 by Herma Carson, RN Outcome: Progressing   Problem: Activity: Goal: Risk for activity intolerance will decrease 09/27/2022 1105 by Herma Carson, RN Outcome: Adequate for Discharge 09/27/2022 0744 by Herma Carson, RN Outcome: Progressing   Problem: Nutrition: Goal: Adequate nutrition will be maintained 09/27/2022 1105 by Herma Carson, RN Outcome: Adequate for Discharge 09/27/2022 0744 by Herma Carson, RN Outcome: Progressing   Problem:  Coping: Goal: Level of anxiety will decrease 09/27/2022 1105 by Herma Carson, RN Outcome: Adequate for Discharge 09/27/2022 0744 by Herma Carson, RN Outcome: Progressing   Problem: Elimination: Goal: Will not experience complications related to bowel motility 09/27/2022 1105 by Herma Carson, RN Outcome: Adequate for Discharge 09/27/2022 0744 by Herma Carson, RN Outcome: Progressing Goal: Will not experience complications related to urinary retention 09/27/2022 1105 by Herma Carson, RN Outcome: Adequate for Discharge 09/27/2022 0744 by Herma Carson, RN Outcome: Progressing   Problem: Pain Managment: Goal: General experience of comfort will improve 09/27/2022 1105 by Herma Carson, RN Outcome: Adequate for Discharge 09/27/2022 0744 by Herma Carson, RN Outcome: Progressing   Problem: Safety: Goal: Ability to remain free from injury will improve 09/27/2022 1105 by Herma Carson, RN Outcome: Adequate for Discharge 09/27/2022 0744 by Herma Carson, RN Outcome: Progressing   Problem: Skin Integrity: Goal: Risk for impaired skin integrity will decrease 09/27/2022 1105 by Herma Carson, RN Outcome: Adequate for Discharge 09/27/2022 0744 by Herma Carson, RN Outcome: Progressing

## 2022-10-25 ENCOUNTER — Ambulatory Visit
Admission: RE | Admit: 2022-10-25 | Discharge: 2022-10-25 | Disposition: A | Discharge status: Home / Self Care | Payer: Medicare Other | Source: Ambulatory Visit | Attending: Family Medicine | Admitting: Family Medicine

## 2022-10-25 DIAGNOSIS — Z1231 Encounter for screening mammogram for malignant neoplasm of breast: Secondary | ICD-10-CM

## 2022-12-01 NOTE — Progress Notes (Deleted)
  Cardiology Office Note:  .   Date:  12/01/2022  ID:  Alma Friendly, DOB 05/28/1940, MRN [id number] PCP: Farris Has, MD  [facility] Providers Cardiologist:  Reatha Harps, MD { Click to update primary MD,subspecialty MD or APP then REFRESH:1}   History of Present Illness: .   GRISHMA BAYNES is a 82 y.o. female with history of HTN, HLD who presents for follow-up. Admitted 09/26/2022 for pre-syncope and bradycardia. Bradycardia 2/2 verapamil which was stopped.      Problem List HTN Aortic stenosis -mild 09/27/2022 3. HLD    ROS: All other ROS reviewed and negative. Pertinent positives noted in the HPI.     Studies Reviewed: Marland Kitchen        TTE 09/27/2022  1. Left ventricular ejection fraction, by estimation, is 65 to 70%. Left  ventricular ejection fraction by PLAX is 67 %. The left ventricle has  normal function. The left ventricle has no regional wall motion  abnormalities. There is moderate asymmetric  left ventricular hypertrophy of the basal-septal segment. Left ventricular  diastolic parameters are consistent with Grade I diastolic dysfunction  (impaired relaxation).   2. Right ventricular systolic function is normal. The right ventricular  size is normal. There is normal pulmonary artery systolic pressure. The  estimated right ventricular systolic pressure is 34.4 mmHg.   3. Left atrial size was moderately dilated.   4. The mitral valve is abnormal. Trivial mitral valve regurgitation.  Moderate mitral annular calcification.   5. Tricuspid valve regurgitation is mild to moderate.   6. The aortic valve is tricuspid. Aortic valve regurgitation is mild.  Mild to moderate aortic valve stenosis. Aortic valve area, by VTI measures  1.48 cm. Aortic valve mean gradient measures 11.7 mmHg. Aortic valve Vmax  measures 2.33 m/s. Peak gradient  21.7 mmHg, DI 0.58.   7. The inferior vena cava is dilated in size with >50% respiratory  variability, suggesting right atrial pressure of 8  mmHg.  Physical Exam:   VS:  There were no vitals taken for this visit.   Wt Readings from Last 3 Encounters:  09/26/22 112 lb 10.5 oz (51.1 kg)  06/10/22 117 lb (53.1 kg)  06/05/21 123 lb 0.3 oz (55.8 kg)    GEN: Well nourished, well developed in no acute distress NECK: No JVD; No carotid bruits CARDIAC: ***RRR, no murmurs, rubs, gallops RESPIRATORY:  Clear to auscultation without rales, wheezing or rhonchi  ABDOMEN: Soft, non-tender, non-distended EXTREMITIES:  No edema; No deformity  ASSESSMENT AND PLAN: .   ***    {Are you ordering a CV Procedure (e.g. stress test, cath, DCCV, TEE, etc)?   Press F2        :[id number]}   Follow-up: No follow-ups on file.  Time Spent with Patient: I have spent a total of *** minutes caring for this patient today face to face, ordering and reviewing labs/tests, reviewing prior records/medical history, examining the patient, establishing an assessment and plan, communicating results/findings to the patient/family, and documenting in the medical record.   Signed, Lenna Gilford. Flora Lipps, MD, [facility]  [facility]  [address] [place], [place] [address] [phone]  7:02 PM

## 2022-12-03 ENCOUNTER — Ambulatory Visit: Payer: Medicare Other | Attending: Cardiovascular Disease | Admitting: Cardiovascular Disease

## 2022-12-03 DIAGNOSIS — I10 Essential (primary) hypertension: Secondary | ICD-10-CM

## 2022-12-03 DIAGNOSIS — I350 Nonrheumatic aortic (valve) stenosis: Secondary | ICD-10-CM

## 2022-12-04 ENCOUNTER — Encounter: Payer: Self-pay | Admitting: Cardiovascular Disease

## 2022-12-11 ENCOUNTER — Other Ambulatory Visit (HOSPITAL_BASED_OUTPATIENT_CLINIC_OR_DEPARTMENT_OTHER): Payer: Self-pay | Admitting: Family Medicine

## 2022-12-11 DIAGNOSIS — R109 Unspecified abdominal pain: Secondary | ICD-10-CM

## 2022-12-13 ENCOUNTER — Ambulatory Visit (HOSPITAL_BASED_OUTPATIENT_CLINIC_OR_DEPARTMENT_OTHER)
Admission: RE | Admit: 2022-12-13 | Discharge: 2022-12-13 | Disposition: A | Discharge status: Home / Self Care | Payer: Medicare Other | Source: Ambulatory Visit | Attending: Family Medicine | Admitting: Family Medicine

## 2022-12-13 DIAGNOSIS — R109 Unspecified abdominal pain: Secondary | ICD-10-CM | POA: Diagnosis present

## 2022-12-13 MED ORDER — IOHEXOL 300 MG/ML  SOLN
100.0000 mL | Freq: Once | INTRAMUSCULAR | Status: AC | PRN
  Administered 2022-12-13: 60 mL via INTRAVENOUS

## 2022-12-17 LAB — POCT I-STAT CREATININE: Creatinine, Ser: 1 mg/dL (ref 0.44–1.00)

## 2023-07-03 ENCOUNTER — Other Ambulatory Visit: Payer: Self-pay | Admitting: Family Medicine

## 2023-07-03 DIAGNOSIS — R519 Headache, unspecified: Secondary | ICD-10-CM

## 2023-08-10 ENCOUNTER — Ambulatory Visit
Admission: RE | Admit: 2023-08-10 | Discharge: 2023-08-10 | Disposition: A | Discharge status: Home / Self Care | Source: Ambulatory Visit | Attending: Family Medicine | Admitting: Family Medicine

## 2023-08-10 DIAGNOSIS — R519 Headache, unspecified: Secondary | ICD-10-CM

## 2023-08-10 MED ORDER — GADOPICLENOL 0.5 MMOL/ML IV SOLN
5.0000 mL | Freq: Once | INTRAVENOUS | Status: AC | PRN
  Administered 2023-08-10: 5 mL via INTRAVENOUS

## 2023-09-15 ENCOUNTER — Other Ambulatory Visit: Payer: Self-pay | Admitting: Family Medicine

## 2023-09-15 DIAGNOSIS — Z1231 Encounter for screening mammogram for malignant neoplasm of breast: Secondary | ICD-10-CM

## 2023-10-27 ENCOUNTER — Ambulatory Visit

## 2023-12-17 ENCOUNTER — Other Ambulatory Visit (HOSPITAL_COMMUNITY): Payer: Self-pay | Admitting: Student

## 2023-12-17 DIAGNOSIS — R600 Localized edema: Secondary | ICD-10-CM

## 2023-12-24 ENCOUNTER — Ambulatory Visit
Admission: RE | Admit: 2023-12-24 | Discharge: 2023-12-24 | Disposition: A | Source: Ambulatory Visit | Attending: Family Medicine | Admitting: Family Medicine

## 2023-12-24 DIAGNOSIS — Z1231 Encounter for screening mammogram for malignant neoplasm of breast: Secondary | ICD-10-CM

## 2024-01-21 ENCOUNTER — Other Ambulatory Visit (HOSPITAL_BASED_OUTPATIENT_CLINIC_OR_DEPARTMENT_OTHER)

## 2024-01-21 ENCOUNTER — Encounter (HOSPITAL_BASED_OUTPATIENT_CLINIC_OR_DEPARTMENT_OTHER): Payer: Self-pay

## 2024-02-25 ENCOUNTER — Other Ambulatory Visit (HOSPITAL_BASED_OUTPATIENT_CLINIC_OR_DEPARTMENT_OTHER)

## 2024-03-23 ENCOUNTER — Other Ambulatory Visit (HOSPITAL_BASED_OUTPATIENT_CLINIC_OR_DEPARTMENT_OTHER)
